# Patient Record
Sex: Male | Born: 1990 | Race: Black or African American | Hispanic: No | Marital: Single | Smoking: Never smoker
Health system: Southern US, Community
[De-identification: ages and names within clinical notes are randomized; demographics above are authoritative.]

## PROBLEM LIST (undated history)

## (undated) DIAGNOSIS — K219 Gastro-esophageal reflux disease without esophagitis: Secondary | ICD-10-CM

## (undated) DIAGNOSIS — D649 Anemia, unspecified: Secondary | ICD-10-CM

## (undated) DIAGNOSIS — L409 Psoriasis, unspecified: Secondary | ICD-10-CM

## (undated) DIAGNOSIS — K649 Unspecified hemorrhoids: Secondary | ICD-10-CM

## (undated) DIAGNOSIS — K5090 Crohn's disease, unspecified, without complications: Secondary | ICD-10-CM

## (undated) DIAGNOSIS — L309 Dermatitis, unspecified: Secondary | ICD-10-CM

## (undated) DIAGNOSIS — T7840XA Allergy, unspecified, initial encounter: Secondary | ICD-10-CM

## (undated) DIAGNOSIS — K2970 Gastritis, unspecified, without bleeding: Secondary | ICD-10-CM

## (undated) HISTORY — PX: ESOPHAGOGASTRODUODENOSCOPY: SHX1529

## (undated) HISTORY — PX: COLONOSCOPY: SHX5424

## (undated) HISTORY — DX: Gastro-esophageal reflux disease without esophagitis: K21.9

## (undated) HISTORY — DX: Crohn's disease, unspecified, without complications: K50.90

## (undated) HISTORY — DX: Anemia, unspecified: D64.9

## (undated) HISTORY — PX: APPENDECTOMY: SHX54

## (undated) HISTORY — PX: COLON SURGERY: SHX602

---

## 2015-05-18 ENCOUNTER — Inpatient Hospital Stay: Payer: BLUE CROSS/BLUE SHIELD

## 2015-05-18 ENCOUNTER — Inpatient Hospital Stay: Payer: BLUE CROSS/BLUE SHIELD | Attending: Hematology and Oncology | Admitting: Hematology and Oncology

## 2015-05-18 ENCOUNTER — Encounter (INDEPENDENT_AMBULATORY_CARE_PROVIDER_SITE_OTHER): Payer: Self-pay

## 2015-05-18 ENCOUNTER — Encounter: Payer: Self-pay | Admitting: Hematology and Oncology

## 2015-05-18 VITALS — BP 117/75 | HR 81 | Temp 96.3°F | Ht 65.0 in | Wt 120.6 lb

## 2015-05-18 DIAGNOSIS — D72819 Decreased white blood cell count, unspecified: Secondary | ICD-10-CM | POA: Insufficient documentation

## 2015-05-18 DIAGNOSIS — Z7952 Long term (current) use of systemic steroids: Secondary | ICD-10-CM | POA: Diagnosis not present

## 2015-05-18 DIAGNOSIS — K5090 Crohn's disease, unspecified, without complications: Secondary | ICD-10-CM | POA: Diagnosis not present

## 2015-05-18 DIAGNOSIS — Z79899 Other long term (current) drug therapy: Secondary | ICD-10-CM | POA: Diagnosis not present

## 2015-05-18 DIAGNOSIS — D509 Iron deficiency anemia, unspecified: Secondary | ICD-10-CM | POA: Insufficient documentation

## 2015-05-18 DIAGNOSIS — K219 Gastro-esophageal reflux disease without esophagitis: Secondary | ICD-10-CM | POA: Insufficient documentation

## 2015-05-18 DIAGNOSIS — K50919 Crohn's disease, unspecified, with unspecified complications: Secondary | ICD-10-CM

## 2015-05-18 LAB — CBC WITH DIFFERENTIAL/PLATELET
Basophils Absolute: 0.1 10*3/uL (ref 0–0.1)
Basophils Relative: 1 %
Eosinophils Absolute: 0.2 10*3/uL (ref 0–0.7)
Eosinophils Relative: 2 %
HCT: 42 % (ref 40.0–52.0)
Hemoglobin: 12.7 g/dL — ABNORMAL LOW (ref 13.0–18.0)
Lymphocytes Relative: 18 %
Lymphs Abs: 1.7 10*3/uL (ref 1.0–3.6)
MCH: 22.3 pg — ABNORMAL LOW (ref 26.0–34.0)
MCHC: 30.3 g/dL — ABNORMAL LOW (ref 32.0–36.0)
MCV: 73.6 fL — ABNORMAL LOW (ref 80.0–100.0)
Monocytes Absolute: 0.6 10*3/uL (ref 0.2–1.0)
Monocytes Relative: 6 %
Neutro Abs: 6.7 10*3/uL — ABNORMAL HIGH (ref 1.4–6.5)
Neutrophils Relative %: 73 %
Platelets: 257 10*3/uL (ref 150–440)
RBC: 5.7 MIL/uL (ref 4.40–5.90)
RDW: 15.9 % — ABNORMAL HIGH (ref 11.5–14.5)
WBC: 9.2 10*3/uL (ref 3.8–10.6)

## 2015-05-18 LAB — IRON AND TIBC
Iron: 20 ug/dL — ABNORMAL LOW (ref 45–182)
Saturation Ratios: 6 % — ABNORMAL LOW (ref 17.9–39.5)
TIBC: 353 ug/dL (ref 250–450)
UIBC: 333 ug/dL

## 2015-05-18 LAB — FERRITIN: Ferritin: 10 ng/mL — ABNORMAL LOW (ref 24–336)

## 2015-05-18 NOTE — Progress Notes (Signed)
Pt here today for initial consult regarding anemia referred by DR. Maryland Pink, PCP at [facility]; offers no complaints today

## 2015-06-11 LAB — COMPREHENSIVE METABOLIC PANEL
ALT: 8 U/L — ABNORMAL LOW (ref 17–63)
AST: 14 U/L — ABNORMAL LOW (ref 15–41)
Albumin: 3.3 g/dL — ABNORMAL LOW (ref 3.5–5.0)
Alkaline Phosphatase: 74 U/L (ref 38–126)
Anion gap: 3 — ABNORMAL LOW (ref 5–15)
BUN: 11 mg/dL (ref 6–20)
CO2: 29 mmol/L (ref 22–32)
Calcium: 8.2 mg/dL — ABNORMAL LOW (ref 8.9–10.3)
Chloride: 104 mmol/L (ref 101–111)
Creatinine, Ser: 1.05 mg/dL (ref 0.61–1.24)
GFR calc Af Amer: >60 mL/min (ref >60)
GFR calc non Af Amer: >60 mL/min (ref >60)
Glucose, Bld: 86 mg/dL (ref 65–99)
Potassium: 4.4 mmol/L (ref 3.5–5.1)
Sodium: 136 mmol/L (ref 135–145)
Total Bilirubin: 0.4 mg/dL (ref 0.3–1.2)
Total Protein: 7.5 g/dL (ref 6.5–8.1)

## 2015-08-16 ENCOUNTER — Encounter: Payer: Self-pay | Admitting: Hematology and Oncology

## 2015-08-16 ENCOUNTER — Other Ambulatory Visit: Payer: Self-pay | Admitting: Hematology and Oncology

## 2015-08-16 NOTE — Progress Notes (Signed)
[facility] day:  05/18/2015  Chief Complaint: Daniel Bird is a 24 y.o. male with Crohn's disease and recurrent iron deficiency anemia who is referred in consultation by Dr. Maryland Pink for ongoing assessment and management.  HPI: The patient was diagnosed with Crohn's disease approximately 4 years ago.  He denies any problems with his bowels. He does note "full body inflammation". He was treated years ago with Humira.  This caused a rash. He was also treated with "immunosuppressants pills" for 2 years.  He is on Cimzia (certolizumab) every 14 days.  He moved from [place] to [place] in 03/2015.  He lives with his grandmother. He states that he "normally gets iron infusions secondary to anemia and Crohn's disease". Last IV iron was either in October or November 2015. He states that he gets IV iron every 4-5 months. He has received Infed in the past.  Iron infusions started in 2015.  He has received 3-4 infusions.  Benadryl was initially given as a premedication then nothing.  He states his last colonoscopy was about a year ago.  He has an appointment with the gastroenterologist at the [facility] clinic on 07/11/2015. He denies any melana or hematochezia.  Available labs from 05/07/2014 included a hematocrit 29.3, hemoglobin 11.7, MCV 62, platelets 362,000 and white count 11,700. CBC on 06/08/2014 revealed a hematocrit 38.1, hemoglobin 10.6, MCV 70, platelets 354,000, and white count 9900. CBC on 02/03/2015 included a hematocrit of 40, hemoglobin 12.4, MCV 76, platelets 318,000, white count 13,400 with an ANC of 10,300. Ferritin was 79 on 06/08/2014 and 104 on 02/03/2015.  He has a history of a fluctuating "low white blood cell count".  He can tell when he is anemic as low blood counts "affect my energy". He currently feels "all right".   He notes that his bowels are okay (daily formed stool).  He denies any melana or hematochezia.  Past  Medical History  Diagnosis Date  . GERD (gastroesophageal reflux disease)   . Anemia     Past Surgical History  Procedure Laterality Date  . Appendectomy      May 2014   No family history on file.  He is an only child.  Social History:  reports that he has never smoked. He has never used smokeless tobacco. He reports that he does not drink alcohol or use illicit drugs.  The patient is accompanied by his great aunt, Daniel Bird.  He lives with his grandmother.  He completed college.  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    Allergies:  Allergies  Allergen Reactions  . Sulfamethoxazole-Trimethoprim Anaphylaxis    Current Medications: Current Outpatient Prescriptions  Medication Sig Dispense Refill  . Certolizumab Pegol 2 X 200 MG/ML KIT Inject into the skin.    Marland Kitchen iron dextran complex (INFED) 50 MG/ML injection Inject into the vein.    . mesalamine (LIALDA) 1.2 G EC tablet Take by mouth.    Marland Kitchen omeprazole (PRILOSEC) 40 MG capsule Take by mouth.    . prednisoLONE acetate (PRED FORTE) 1 % ophthalmic suspension Apply to eye.     No current facility-administered medications for this visit.    Review of Systems:  GENERAL:  Feels alright.  Active.  No fevers, sweats or weight loss.  Weight normally 121 pounds (lowest 116 pounds). PERFORMANCE STATUS (ECOG):  1 HEENT:  Teeth affected.  No visual changes, runny nose, sore throat, mouth sores or  tenderness. Lungs: No shortness of breath or cough.  No hemoptysis. Cardiac:  No chest pain, palpitations, orthopnea, or PND. GI:  Eats a lot. Formed daily bowel movements.  No nausea, vomiting, diarrhea, constipation, melena or hematochezia. GU:  No urgency, frequency, dysuria, or hematuria. Musculoskeletal:  No back pain.  No joint pain.  No muscle tenderness. Extremities:  No pain or swelling. Skin:  No rashes or skin changes. Neuro:  No headache, numbness or weakness, balance or coordination  issues. Endocrine:  No diabetes, thyroid issues, hot flashes or night sweats. Psych:  No mood changes, depression or anxiety. Pain:  No focal pain. Review of systems:  All other systems reviewed and found to be negative.  Physical Exam: Blood pressure 117/75, pulse 81, temperature 96.3 F (35.7 C), temperature source Tympanic, height 5' 5" (1.651 m), weight 120 lb 9.5 oz (54.701 kg). GENERAL:  Thin gentleman sitting comfortably in the exam room in no acute distress. MENTAL STATUS:  Alert and oriented to person, place and time. HEAD:  Dark curly hair.  Normocephalic, atraumatic, face symmetric, no Cushingoid features. EYES:  Pupils equal round and reactive to light and accomodation.  No conjunctivitis or scleral icterus. ENT:  Oropharynx clear without lesion.  Edentulous.  Tongue normal. Mucous membranes moist.  RESPIRATORY:  Clear to auscultation without rales, wheezes or rhonchi. CARDIOVASCULAR:  Regular rate and rhythm without murmur, rub or gallop. ABDOMEN:  Well healed midline incision.  Soft, non-tender, with active bowel sounds, and no hepatosplenomegaly.  No masses. SKIN:  No rashes, ulcers or lesions. EXTREMITIES: No edema, no skin discoloration or tenderness.  No palpable cords. LYMPH NODES: No palpable cervical, supraclavicular, axillary or inguinal adenopathy  NEUROLOGICAL: Unremarkable. PSYCH:  Appropriate.  Appointment on 05/18/2015  Component Date Value Ref Range Status  . WBC 05/18/2015 9.2  3.8 - 10.6 K/uL Final  . RBC 05/18/2015 5.70  4.40 - 5.90 MIL/uL Final  . Hemoglobin 05/18/2015 12.7* 13.0 - 18.0 g/dL Final   RESULT REPEATED AND VERIFIED  . HCT 05/18/2015 42.0  40.0 - 52.0 % Final   RESULT REPEATED AND VERIFIED  . MCV 05/18/2015 73.6* 80.0 - 100.0 fL Final  . MCH 05/18/2015 22.3* 26.0 - 34.0 pg Final  . MCHC 05/18/2015 30.3* 32.0 - 36.0 g/dL Final  . RDW 05/18/2015 15.9* 11.5 - 14.5 % Final  . Platelets 05/18/2015 257  150 - 440 K/uL Final  . Neutrophils  Relative % 05/18/2015 73%   Final  . Neutro Abs 05/18/2015 6.7* 1.4 - 6.5 K/uL Final  . Lymphocytes Relative 05/18/2015 18%   Final  . Lymphs Abs 05/18/2015 1.7  1.0 - 3.6 K/uL Final  . Monocytes Relative 05/18/2015 6%   Final  . Monocytes Absolute 05/18/2015 0.6  0.2 - 1.0 K/uL Final  . Eosinophils Relative 05/18/2015 2%   Final  . Eosinophils Absolute 05/18/2015 0.2  0 - 0.7 K/uL Final  . Basophils Relative 05/18/2015 1%   Final  . Basophils Absolute 05/18/2015 0.1  0 - 0.1 K/uL Final  . Ferritin 05/18/2015 10* 24 - 336 ng/mL Final  . Iron 05/18/2015 20* 45 - 182 ug/dL Final  . TIBC 05/18/2015 353  250 - 450 ug/dL Final  . Saturation Ratios 05/18/2015 6* 17.9 - 39.5 % Final  . UIBC 05/18/2015 333   Final  . Sodium 05/18/2015 136  135 - 145 mmol/L Final  . Potassium 05/18/2015 4.4  3.5 - 5.1 mmol/L Final  . Chloride 05/18/2015 104  101 - 111 mmol/L Final  .  CO2 05/18/2015 29  22 - 32 mmol/L Final  . Glucose, Bld 05/18/2015 86  65 - 99 mg/dL Final  . BUN 05/18/2015 11  6 - 20 mg/dL Final  . Creatinine, Ser 05/18/2015 1.05  0.61 - 1.24 mg/dL Final  . Calcium 05/18/2015 8.2* 8.9 - 10.3 mg/dL Final  . Total Protein 05/18/2015 7.5  6.5 - 8.1 g/dL Final  . Albumin 05/18/2015 3.3* 3.5 - 5.0 g/dL Final  . AST 05/18/2015 14* 15 - 41 U/L Final  . ALT 05/18/2015 8* 17 - 63 U/L Final  . Alkaline Phosphatase 05/18/2015 74  38 - 126 U/L Final  . Total Bilirubin 05/18/2015 0.4  0.3 - 1.2 mg/dL Final  . GFR calc non Af Amer 05/18/2015 >60  >60 mL/min Final  . GFR calc Af Amer 05/18/2015 >60  >60 mL/min Final  . Anion gap 05/18/2015 3* 5 - 15 Final    Assessment:  Keishaun Hazel is a 24 y.o. male with Crohn's disease and recurrent iron deficiency anemia.  He received IV iron 3-4 times in 2015 while living in [place].  Colonoscopy in 2015 was negative per patient report.  Symptomatically, he is doing well.  Exam is unremarkable. Diet is good.  He is not having any issues with his bowels.  He  has chronic microcytic RBC indices.  Hematocrit is stable to improved from 01/2015.  He is not anemic.    Plan: 1. Labs today:  CBC with diff, CMP, ferritin, iron studies. 2. Discuss normal CBC today, but based on history, anticipate need for IV iron in next few months. 3. Follow-up as scheduled with gastroenterology on 07/11/2015. 4. RTC in 3 months for MD assessment and labs (CBC with diff, ferritin).   Lequita Asal, MD  05/18/2015

## 2015-08-17 ENCOUNTER — Other Ambulatory Visit: Payer: Self-pay | Admitting: Hematology and Oncology

## 2015-08-17 ENCOUNTER — Inpatient Hospital Stay: Payer: BLUE CROSS/BLUE SHIELD | Attending: Hematology and Oncology

## 2015-08-17 ENCOUNTER — Inpatient Hospital Stay (HOSPITAL_BASED_OUTPATIENT_CLINIC_OR_DEPARTMENT_OTHER): Payer: BLUE CROSS/BLUE SHIELD | Admitting: Hematology and Oncology

## 2015-08-17 VITALS — BP 112/71 | HR 76 | Temp 95.2°F | Resp 18 | Ht 64.0 in | Wt 117.4 lb

## 2015-08-17 DIAGNOSIS — K5090 Crohn's disease, unspecified, without complications: Secondary | ICD-10-CM

## 2015-08-17 DIAGNOSIS — R5383 Other fatigue: Secondary | ICD-10-CM

## 2015-08-17 DIAGNOSIS — Z79899 Other long term (current) drug therapy: Secondary | ICD-10-CM

## 2015-08-17 DIAGNOSIS — K50919 Crohn's disease, unspecified, with unspecified complications: Secondary | ICD-10-CM

## 2015-08-17 DIAGNOSIS — D509 Iron deficiency anemia, unspecified: Secondary | ICD-10-CM | POA: Insufficient documentation

## 2015-08-17 DIAGNOSIS — K219 Gastro-esophageal reflux disease without esophagitis: Secondary | ICD-10-CM | POA: Insufficient documentation

## 2015-08-17 LAB — CBC WITH DIFFERENTIAL/PLATELET
Basophils Absolute: 0 10*3/uL (ref 0–0.1)
Basophils Relative: 1 %
Eosinophils Absolute: 0.1 10*3/uL (ref 0–0.7)
Eosinophils Relative: 1 %
HCT: 35.9 % — ABNORMAL LOW (ref 40.0–52.0)
Hemoglobin: 10.8 g/dL — ABNORMAL LOW (ref 13.0–18.0)
Lymphocytes Relative: 19 %
Lymphs Abs: 1.5 10*3/uL (ref 1.0–3.6)
MCH: 20.4 pg — ABNORMAL LOW (ref 26.0–34.0)
MCHC: 30.2 g/dL — ABNORMAL LOW (ref 32.0–36.0)
MCV: 67.7 fL — ABNORMAL LOW (ref 80.0–100.0)
Monocytes Absolute: 0.6 10*3/uL (ref 0.2–1.0)
Monocytes Relative: 7 %
Neutro Abs: 5.9 10*3/uL (ref 1.4–6.5)
Neutrophils Relative %: 72 %
Platelets: 290 10*3/uL (ref 150–440)
RBC: 5.31 MIL/uL (ref 4.40–5.90)
RDW: 17.6 % — ABNORMAL HIGH (ref 11.5–14.5)
WBC: 8.2 10*3/uL (ref 3.8–10.6)

## 2015-08-17 LAB — IRON AND TIBC
Iron: 18 ug/dL — ABNORMAL LOW (ref 45–182)
Saturation Ratios: 5 % — ABNORMAL LOW (ref 17.9–39.5)
TIBC: 375 ug/dL (ref 250–450)
UIBC: 357 ug/dL

## 2015-08-17 LAB — FOLATE: Folate: 12.1 ng/mL (ref >5.9)

## 2015-08-17 LAB — VITAMIN B12: Vitamin B-12: 455 pg/mL (ref 180–914)

## 2015-08-17 LAB — FERRITIN: Ferritin: 11 ng/mL — ABNORMAL LOW (ref 24–336)

## 2015-08-17 NOTE — Progress Notes (Signed)
No changes since last visit here for follow up anemia

## 2015-08-17 NOTE — Progress Notes (Signed)
[facility] day:  08/17/2015   Chief Complaint: Daniel Bird is a 24 y.o. male with Crohn's disease and recurrent iron deficiency anemia who is seen for 3 month assessment.  HPI: The patient  was last seen in the medical oncology clinic on 05/18/2015.  At that time, he was seen for initial consultation.  He had iron deficiency anemia with a history of Crohn's disease.  His last colonoscopy was a year ago in [place].  He had received IV iron 3-4 times in the past.  CBC revealed a hematocrit of 42.0, hemoglobin 12.7, MCV 73.6, and ferritin 10.  During the interim, he has had low energy. He feels more tired than last visit. He describes sleeping more. He is not working. He is in school. He is eating well. He denies any bowel symptoms. He states that it has not been a year since his last colonoscopy (10 or 10/2014).  He denies any melana or hematochezia.  Past Medical History  Diagnosis Date  . GERD (gastroesophageal reflux disease)   . Anemia     Past Surgical History  Procedure Laterality Date  . Appendectomy      May 2014   No family history on file.  He is an only child.  Social History:  reports that he has never smoked. He has never used smokeless tobacco. He reports that he does not drink alcohol or use illicit drugs.  The patient is accompanied by his great aunt, Vaughan Basta.  He lives with his grandmother.  He completed college.  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  Allergies:  Allergies  Allergen Reactions  . Sulfamethoxazole-Trimethoprim Anaphylaxis  . Rifampin Other (See Comments)    Flu like illness    Current Medications: Current Outpatient Prescriptions  Medication Sig Dispense Refill  . budesonide (ENTOCORT EC) 3 MG 24 hr capsule Take by mouth.    . ciprofloxacin (CIPRO) 500 MG tablet Take by mouth.    . mesalamine (LIALDA) 1.2 G EC tablet Take by  mouth.    Marland Kitchen omeprazole (PRILOSEC) 40 MG capsule Take by mouth.    . prednisoLONE acetate (PRED FORTE) 1 % ophthalmic suspension Apply to eye.    . Certolizumab Pegol 2 X 200 MG/ML KIT Inject into the skin.    Marland Kitchen CIMZIA PREFILLED 2 X 200 MG/ML KIT   0   No current facility-administered medications for this visit.    Review of Systems:  GENERAL:  Feels tired.  No fevers or sweats.  Weight normally 121 pounds (lowest 116 pounds). PERFORMANCE STATUS (ECOG):  1 HEENT:  Teeth affected.  No visual changes, runny nose, sore throat, mouth sores or tenderness. Lungs: No shortness of breath or cough.  No hemoptysis. Cardiac:  No chest pain, palpitations, orthopnea, or PND. GI:  Eating more. Formed daily bowel movements.  No nausea, vomiting, diarrhea, constipation, melena or hematochezia. GU:  No urgency, frequency, dysuria, or hematuria. Musculoskeletal:  No back pain.  No joint pain.  No muscle tenderness. Extremities:  No pain or swelling. Skin:  No rashes or skin changes. Neuro:  No headache, numbness or weakness, balance or coordination issues. Endocrine:  No diabetes, thyroid issues, hot flashes or night sweats. Psych:  No mood changes, depression or anxiety. Pain:  No focal pain. Review of systems:  All other systems reviewed and found to be negative.  Physical Exam: Blood pressure 112/71, pulse  76, temperature 95.2 F (35.1 C), temperature source Tympanic, resp. rate 18, height 5' 4"  (1.626 m), weight 117 lb 6.3 oz (53.25 kg). GENERAL:  Thin gentleman sitting comfortably in the exam room in no acute distress. MENTAL STATUS:  Alert and oriented to person, place and time. HEAD:  Dark curly hair.  Temporal wasting.  Normocephalic, atraumatic, face symmetric, no Cushingoid features. EYES:  Pupils equal round and reactive to light and accomodation.  No conjunctivitis or scleral icterus. ENT:  Oropharynx clear without lesion.  Edentulous.  Tongue normal. Mucous membranes moist.  RESPIRATORY:   Clear to auscultation without rales, wheezes or rhonchi. CARDIOVASCULAR:  Regular rate and rhythm without murmur, rub or gallop. ABDOMEN:  Well healed midline incision.  Soft, non-tender, with active bowel sounds, and no hepatosplenomegaly.  No masses. SKIN:  No rashes, ulcers or lesions. EXTREMITIES: No edema, no skin discoloration or tenderness.  No palpable cords. LYMPH NODES: No palpable cervical, supraclavicular, axillary or inguinal adenopathy  NEUROLOGICAL: Unremarkable. PSYCH:  Appropriate.  No visits with results within 3 Day(s) from this visit. Latest known visit with results is:  Appointment on 05/18/2015  Component Date Value Ref Range Status  . WBC 05/18/2015 9.2  3.8 - 10.6 K/uL Final  . RBC 05/18/2015 5.70  4.40 - 5.90 MIL/uL Final  . Hemoglobin 05/18/2015 12.7* 13.0 - 18.0 g/dL Final   RESULT REPEATED AND VERIFIED  . HCT 05/18/2015 42.0  40.0 - 52.0 % Final   RESULT REPEATED AND VERIFIED  . MCV 05/18/2015 73.6* 80.0 - 100.0 fL Final  . MCH 05/18/2015 22.3* 26.0 - 34.0 pg Final  . MCHC 05/18/2015 30.3* 32.0 - 36.0 g/dL Final  . RDW 05/18/2015 15.9* 11.5 - 14.5 % Final  . Platelets 05/18/2015 257  150 - 440 K/uL Final  . Neutrophils Relative % 05/18/2015 73%   Final  . Neutro Abs 05/18/2015 6.7* 1.4 - 6.5 K/uL Final  . Lymphocytes Relative 05/18/2015 18%   Final  . Lymphs Abs 05/18/2015 1.7  1.0 - 3.6 K/uL Final  . Monocytes Relative 05/18/2015 6%   Final  . Monocytes Absolute 05/18/2015 0.6  0.2 - 1.0 K/uL Final  . Eosinophils Relative 05/18/2015 2%   Final  . Eosinophils Absolute 05/18/2015 0.2  0 - 0.7 K/uL Final  . Basophils Relative 05/18/2015 1%   Final  . Basophils Absolute 05/18/2015 0.1  0 - 0.1 K/uL Final  . Ferritin 05/18/2015 10* 24 - 336 ng/mL Final  . Iron 05/18/2015 20* 45 - 182 ug/dL Final  . TIBC 05/18/2015 353  250 - 450 ug/dL Final  . Saturation Ratios 05/18/2015 6* 17.9 - 39.5 % Final  . UIBC 05/18/2015 333   Final  . Sodium 05/18/2015 136   135 - 145 mmol/L Final  . Potassium 05/18/2015 4.4  3.5 - 5.1 mmol/L Final  . Chloride 05/18/2015 104  101 - 111 mmol/L Final  . CO2 05/18/2015 29  22 - 32 mmol/L Final  . Glucose, Bld 05/18/2015 86  65 - 99 mg/dL Final  . BUN 05/18/2015 11  6 - 20 mg/dL Final  . Creatinine, Ser 05/18/2015 1.05  0.61 - 1.24 mg/dL Final  . Calcium 05/18/2015 8.2* 8.9 - 10.3 mg/dL Final  . Total Protein 05/18/2015 7.5  6.5 - 8.1 g/dL Final  . Albumin 05/18/2015 3.3* 3.5 - 5.0 g/dL Final  . AST 05/18/2015 14* 15 - 41 U/L Final  . ALT 05/18/2015 8* 17 - 63 U/L Final  . Alkaline Phosphatase 05/18/2015 74  38 - 126 U/L Final  . Total Bilirubin 05/18/2015 0.4  0.3 - 1.2 mg/dL Final  . GFR calc non Af Amer 05/18/2015 >60  >60 mL/min Final  . GFR calc Af Amer 05/18/2015 >60  >60 mL/min Final  . Anion gap 05/18/2015 3* 5 - 15 Final    Assessment:  Daniel Bird is a 24 y.o. male with Crohn's disease and recurrent iron deficiency anemia.  He received IV iron 3-4 times in 2015 while living in [place].  Colonoscopy in 2015 was negative per patient report.  Symptomatically, he is fatigued.  Exam is unremarkable. Diet is good.  He denies any bowel issues.  Hematocrit has drifted down from 42 on 05/18/2015 to 35.9 today.  RBCs are more microcytic.  Ferritin is 10.    Plan: 1. Labs today:  CBC with diff, CMP, ferritin, iron studies. 2. Follow-up preauth for Feraheme. 3. Once preauthorized, schedule Feraheme x1 given patient's weight. 4. RTC in 6 weeks for labs (CBC with diff, ferritin). 5. RTC in 3 months for MD assessment and labs (CBC with diff, ferritin) +/- Feraheme.   Lequita Asal, MD  08/17/2015, 3:35 PM

## 2015-08-22 ENCOUNTER — Other Ambulatory Visit: Payer: Self-pay | Admitting: Hematology and Oncology

## 2015-08-27 ENCOUNTER — Inpatient Hospital Stay: Payer: BLUE CROSS/BLUE SHIELD | Attending: Hematology and Oncology

## 2015-08-27 DIAGNOSIS — K5090 Crohn's disease, unspecified, without complications: Secondary | ICD-10-CM | POA: Diagnosis not present

## 2015-08-27 DIAGNOSIS — D509 Iron deficiency anemia, unspecified: Secondary | ICD-10-CM

## 2015-08-27 DIAGNOSIS — D72819 Decreased white blood cell count, unspecified: Secondary | ICD-10-CM | POA: Insufficient documentation

## 2015-08-27 MED ORDER — FERUMOXYTOL INJECTION 510 MG/17 ML
510.0000 mg | Freq: Once | INTRAVENOUS | Status: AC
  Administered 2015-08-27: 510 mg via INTRAVENOUS
  Filled 2015-08-27: qty 17

## 2015-08-27 MED ORDER — SODIUM CHLORIDE 0.9 % IV SOLN
Freq: Once | INTRAVENOUS | Status: AC
  Administered 2015-08-27: 10 mL/h via INTRAVENOUS
  Filled 2015-08-27: qty 1000

## 2015-09-03 ENCOUNTER — Ambulatory Visit: Payer: BLUE CROSS/BLUE SHIELD

## 2015-09-28 ENCOUNTER — Inpatient Hospital Stay: Payer: BLUE CROSS/BLUE SHIELD | Attending: Hematology and Oncology

## 2015-09-28 ENCOUNTER — Other Ambulatory Visit: Payer: Self-pay

## 2015-09-28 DIAGNOSIS — K5090 Crohn's disease, unspecified, without complications: Secondary | ICD-10-CM | POA: Insufficient documentation

## 2015-09-28 DIAGNOSIS — D509 Iron deficiency anemia, unspecified: Secondary | ICD-10-CM

## 2015-09-28 DIAGNOSIS — D72819 Decreased white blood cell count, unspecified: Secondary | ICD-10-CM | POA: Insufficient documentation

## 2015-09-28 LAB — CBC WITH DIFFERENTIAL/PLATELET
Basophils Absolute: 0.1 10*3/uL (ref 0–0.1)
Basophils Relative: 1 %
Eosinophils Absolute: 0.1 10*3/uL (ref 0–0.7)
Eosinophils Relative: 2 %
HCT: 41.1 % (ref 40.0–52.0)
Hemoglobin: 12.5 g/dL — ABNORMAL LOW (ref 13.0–18.0)
Lymphocytes Relative: 25 %
Lymphs Abs: 1.8 10*3/uL (ref 1.0–3.6)
MCH: 21.6 pg — ABNORMAL LOW (ref 26.0–34.0)
MCHC: 30.5 g/dL — ABNORMAL LOW (ref 32.0–36.0)
MCV: 70.8 fL — ABNORMAL LOW (ref 80.0–100.0)
Monocytes Absolute: 0.5 10*3/uL (ref 0.2–1.0)
Monocytes Relative: 7 %
Neutro Abs: 4.6 10*3/uL (ref 1.4–6.5)
Neutrophils Relative %: 65 %
Platelets: 250 10*3/uL (ref 150–440)
RBC: 5.8 MIL/uL (ref 4.40–5.90)
RDW: 21.5 % — ABNORMAL HIGH (ref 11.5–14.5)
WBC: 7.2 10*3/uL (ref 3.8–10.6)

## 2015-09-28 LAB — IRON AND TIBC
Iron: 26 ug/dL — ABNORMAL LOW (ref 45–182)
Saturation Ratios: 8 % — ABNORMAL LOW (ref 17.9–39.5)
TIBC: 333 ug/dL (ref 250–450)
UIBC: 307 ug/dL

## 2015-09-28 LAB — FERRITIN: Ferritin: 13 ng/mL — ABNORMAL LOW (ref 24–336)

## 2015-11-09 ENCOUNTER — Ambulatory Visit: Payer: BLUE CROSS/BLUE SHIELD | Admitting: Hematology and Oncology

## 2015-11-09 ENCOUNTER — Other Ambulatory Visit: Payer: BLUE CROSS/BLUE SHIELD

## 2015-11-09 ENCOUNTER — Ambulatory Visit: Payer: BLUE CROSS/BLUE SHIELD

## 2015-11-09 ENCOUNTER — Ambulatory Visit: Payer: BLUE CROSS/BLUE SHIELD | Admitting: Oncology

## 2015-11-11 ENCOUNTER — Inpatient Hospital Stay: Payer: BLUE CROSS/BLUE SHIELD

## 2015-11-11 ENCOUNTER — Inpatient Hospital Stay (HOSPITAL_BASED_OUTPATIENT_CLINIC_OR_DEPARTMENT_OTHER): Payer: BLUE CROSS/BLUE SHIELD | Admitting: Family Medicine

## 2015-11-11 ENCOUNTER — Ambulatory Visit: Payer: BLUE CROSS/BLUE SHIELD

## 2015-11-11 ENCOUNTER — Inpatient Hospital Stay: Payer: BLUE CROSS/BLUE SHIELD | Attending: Hematology and Oncology

## 2015-11-11 ENCOUNTER — Ambulatory Visit: Payer: BLUE CROSS/BLUE SHIELD | Admitting: Hematology and Oncology

## 2015-11-11 ENCOUNTER — Encounter: Payer: Self-pay | Admitting: Hematology and Oncology

## 2015-11-11 VITALS — BP 121/71 | HR 79 | Temp 95.8°F | Resp 18 | Ht 64.0 in | Wt 120.8 lb

## 2015-11-11 DIAGNOSIS — K5090 Crohn's disease, unspecified, without complications: Secondary | ICD-10-CM | POA: Diagnosis not present

## 2015-11-11 DIAGNOSIS — D509 Iron deficiency anemia, unspecified: Secondary | ICD-10-CM | POA: Insufficient documentation

## 2015-11-11 DIAGNOSIS — K219 Gastro-esophageal reflux disease without esophagitis: Secondary | ICD-10-CM

## 2015-11-11 DIAGNOSIS — Z79899 Other long term (current) drug therapy: Secondary | ICD-10-CM | POA: Diagnosis not present

## 2015-11-11 LAB — CBC WITH DIFFERENTIAL/PLATELET
Basophils Absolute: 0 10*3/uL (ref 0–0.1)
Basophils Relative: 1 %
Eosinophils Absolute: 0.1 10*3/uL (ref 0–0.7)
Eosinophils Relative: 2 %
HCT: 40.8 % (ref 40.0–52.0)
Hemoglobin: 12.4 g/dL — ABNORMAL LOW (ref 13.0–18.0)
Lymphocytes Relative: 23 %
Lymphs Abs: 1.6 10*3/uL (ref 1.0–3.6)
MCH: 21.3 pg — ABNORMAL LOW (ref 26.0–34.0)
MCHC: 30.5 g/dL — ABNORMAL LOW (ref 32.0–36.0)
MCV: 69.8 fL — ABNORMAL LOW (ref 80.0–100.0)
Monocytes Absolute: 0.5 10*3/uL (ref 0.2–1.0)
Monocytes Relative: 7 %
Neutro Abs: 4.8 10*3/uL (ref 1.4–6.5)
Neutrophils Relative %: 67 %
Platelets: 253 10*3/uL (ref 150–440)
RBC: 5.85 MIL/uL (ref 4.40–5.90)
RDW: 19.5 % — ABNORMAL HIGH (ref 11.5–14.5)
WBC: 7.1 10*3/uL (ref 3.8–10.6)

## 2015-11-11 LAB — FERRITIN: Ferritin: 7 ng/mL — ABNORMAL LOW (ref 24–336)

## 2015-11-11 NOTE — Progress Notes (Signed)
[facility] day:  11/11/2015   Chief Complaint: Daniel Bird is a 24 y.o. male with Crohn's disease and recurrent iron deficiency anemia who is seen for 3 month assessment.  HPI: The patient  was last seen in the medical oncology clinic on 08/17/2015.  He had iron deficiency anemia with a history of Crohn's disease.  His last colonoscopy was a year ago in [place].  He had received IV iron 3-4 times in the past.  CBC on revealed a hematocrit of 35.9, hemoglobin 10.8, MCV 67.7, and ferritin 11.  During the interim, He has had no complaints. He is not working. He is in school. He is eating well. He denies any bowel symptoms. He states that it has not been a year since his last colonoscopy (10 or 10/2014).  He denies any melana or hematochezia.  Past Medical History  Diagnosis Date  . GERD (gastroesophageal reflux disease)   . Anemia     Past Surgical History  Procedure Laterality Date  . Appendectomy      May 2014   No family history on file.  He is an only child.  Social History:  reports that he has never smoked. He has never used smokeless tobacco. He reports that he does not drink alcohol or use illicit drugs.  He lives with his grandmother.  He completed college.  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    Allergies:  Allergies  Allergen Reactions  . Sulfamethoxazole-Trimethoprim Anaphylaxis  . Rifampin Other (See Comments)    Flu like illness    Current Medications: Current Outpatient Prescriptions  Medication Sig Dispense Refill  . budesonide (ENTOCORT EC) 3 MG 24 hr capsule Take by mouth.    Marland Kitchen CIMZIA PREFILLED 2 X 200 MG/ML KIT   0  . isoniazid (NYDRAZID) 300 MG tablet Take by mouth.    . mesalamine (LIALDA) 1.2 G EC tablet Take by mouth.    Marland Kitchen omeprazole (PRILOSEC) 40 MG capsule Take by mouth.    . prednisoLONE acetate (PRED FORTE) 1 % ophthalmic suspension Apply to  eye.    . Certolizumab Pegol 2 X 200 MG/ML KIT Inject into the skin.     No current facility-administered medications for this visit.    Review of Systems:  GENERAL:  No fevers or sweats.  Weight normally 121 pounds (lowest 116 pounds). PERFORMANCE STATUS (ECOG):  1 HEENT:  Teeth affected.  No visual changes, runny nose, sore throat, mouth sores or tenderness. Lungs: No shortness of breath or cough.  No hemoptysis. Cardiac:  No chest pain, palpitations, orthopnea, or PND. GI:  Eating more. Formed daily bowel movements.  No nausea, vomiting, diarrhea, constipation, melena or hematochezia. GU:  No urgency, frequency, dysuria, or hematuria. Musculoskeletal:  No back pain.  No joint pain.  No muscle tenderness. Extremities:  No pain or swelling. Skin:  No rashes or skin changes. Neuro:  No headache, numbness or weakness, balance or coordination issues. Endocrine:  No diabetes, thyroid issues, hot flashes or night sweats. Psych:  No mood changes, depression or anxiety. Pain:  No focal pain. Review of systems:  All other systems reviewed and found to be negative.  Physical Exam: Blood pressure 121/71, pulse 79, temperature 95.8 F (35.4 C), temperature source Tympanic, resp. rate 18, height _0  (1.626 m), weight 120 lb 13 oz (54.8 kg). GENERAL:  Thin gentleman sitting comfortably in the exam room in no  acute distress. MENTAL STATUS:  Alert and oriented to person, place and time. HEAD:  Dark curly hair.  Temporal wasting.  Normocephalic, atraumatic, face symmetric, no Cushingoid features. EYES:  Pupils equal round and reactive to light and accomodation.  No conjunctivitis or scleral icterus. ENT:  Oropharynx clear without lesion.  Edentulous.  Tongue normal. Mucous membranes moist.  RESPIRATORY:  Clear to auscultation without rales, wheezes or rhonchi. CARDIOVASCULAR:  Regular rate and rhythm without murmur, rub or gallop. ABDOMEN:  Well healed midline incision.  Soft, non-tender, with  active bowel sounds, and no hepatosplenomegaly.  No masses. SKIN:  No rashes, ulcers or lesions. EXTREMITIES: No edema, no skin discoloration or tenderness.  No palpable cords. LYMPH NODES: No palpable cervical, supraclavicular, axillary or inguinal adenopathy  NEUROLOGICAL: Unremarkable. PSYCH:  Appropriate.  Appointment on 11/11/2015  Component Date Value Ref Range Status  . WBC 11/11/2015 7.1  3.8 - 10.6 K/uL Final  . RBC 11/11/2015 5.85  4.40 - 5.90 MIL/uL Final  . Hemoglobin 11/11/2015 12.4* 13.0 - 18.0 g/dL Final   RESULT REPEATED AND VERIFIED  . HCT 11/11/2015 40.8  40.0 - 52.0 % Final   RESULT REPEATED AND VERIFIED  . MCV 11/11/2015 69.8* 80.0 - 100.0 fL Final  . MCH 11/11/2015 21.3* 26.0 - 34.0 pg Final  . MCHC 11/11/2015 30.5* 32.0 - 36.0 g/dL Final  . RDW 11/11/2015 19.5* 11.5 - 14.5 % Final  . Platelets 11/11/2015 253  150 - 440 K/uL Final  . Neutrophils Relative % 11/11/2015 67%   Final  . Neutro Abs 11/11/2015 4.8  1.4 - 6.5 K/uL Final  . Lymphocytes Relative 11/11/2015 23%   Final  . Lymphs Abs 11/11/2015 1.6  1.0 - 3.6 K/uL Final  . Monocytes Relative 11/11/2015 7%   Final  . Monocytes Absolute 11/11/2015 0.5  0.2 - 1.0 K/uL Final  . Eosinophils Relative 11/11/2015 2%   Final  . Eosinophils Absolute 11/11/2015 0.1  0 - 0.7 K/uL Final  . Basophils Relative 11/11/2015 1%   Final  . Basophils Absolute 11/11/2015 0.0  0 - 0.1 K/uL Final    Assessment:  Daniel Bird is a 24 y.o. male with Crohn's disease and recurrent iron deficiency anemia.  He received IV iron 3-4 times in 2015 while living in [place].  Colonoscopy in 2015 was negative per patient report.  Symptomatically, he has no complaints today.  Exam is unremarkable. Diet is good.  He denies any bowel issues.  Hematocrit has drifted slightly to 40.8.  RBCs are more microcytic.  Ferritin is 7.    Plan: 1. Labs today:  CBC with diff, CMP, ferritin, iron studies. 2. RTC tomorrow for feraheme  infusion. 3. RTC in 3 months for MD assessment and labs (CBC with diff, ferritin) +/- Feraheme.   Evlyn Kanner, NP  11/11/2015, 3:25 PM

## 2015-11-11 NOTE — Progress Notes (Signed)
Pt reports no changes since last MD visit.  No concerns today.

## 2015-11-12 ENCOUNTER — Inpatient Hospital Stay: Payer: BLUE CROSS/BLUE SHIELD

## 2015-11-12 ENCOUNTER — Other Ambulatory Visit: Payer: Self-pay | Admitting: Family Medicine

## 2015-11-12 VITALS — BP 119/73 | HR 82 | Temp 97.8°F | Resp 18

## 2015-11-12 DIAGNOSIS — D509 Iron deficiency anemia, unspecified: Secondary | ICD-10-CM

## 2015-11-12 MED ORDER — SODIUM CHLORIDE 0.9 % IV SOLN
510.0000 mg | Freq: Once | INTRAVENOUS | Status: AC
  Administered 2015-11-12: 510 mg via INTRAVENOUS
  Filled 2015-11-12: qty 17

## 2015-11-12 MED ORDER — SODIUM CHLORIDE 0.9 % IV SOLN
Freq: Once | INTRAVENOUS | Status: AC
  Administered 2015-11-12: 14:00:00 via INTRAVENOUS
  Filled 2015-11-12: qty 1000

## 2015-11-17 ENCOUNTER — Other Ambulatory Visit: Payer: Self-pay | Admitting: Family Medicine

## 2015-11-17 NOTE — Progress Notes (Unsigned)
Addendum to 11/11/15 note: Dr. Grayland Ormond was available for consultation and review of plan of care for this patient.

## 2016-01-28 ENCOUNTER — Other Ambulatory Visit: Payer: Self-pay

## 2016-01-28 DIAGNOSIS — D509 Iron deficiency anemia, unspecified: Secondary | ICD-10-CM

## 2016-02-03 ENCOUNTER — Other Ambulatory Visit: Payer: Self-pay | Admitting: Hematology and Oncology

## 2016-02-03 ENCOUNTER — Inpatient Hospital Stay (HOSPITAL_BASED_OUTPATIENT_CLINIC_OR_DEPARTMENT_OTHER): Payer: BLUE CROSS/BLUE SHIELD | Admitting: Family Medicine

## 2016-02-03 ENCOUNTER — Encounter: Payer: Self-pay | Admitting: Family Medicine

## 2016-02-03 ENCOUNTER — Inpatient Hospital Stay: Payer: BLUE CROSS/BLUE SHIELD | Attending: Internal Medicine

## 2016-02-03 ENCOUNTER — Inpatient Hospital Stay: Payer: BLUE CROSS/BLUE SHIELD

## 2016-02-03 VITALS — BP 106/69 | HR 77 | Temp 97.7°F | Resp 18 | Ht 64.0 in | Wt 122.6 lb

## 2016-02-03 DIAGNOSIS — D509 Iron deficiency anemia, unspecified: Secondary | ICD-10-CM

## 2016-02-03 DIAGNOSIS — K219 Gastro-esophageal reflux disease without esophagitis: Secondary | ICD-10-CM

## 2016-02-03 DIAGNOSIS — K5090 Crohn's disease, unspecified, without complications: Secondary | ICD-10-CM

## 2016-02-03 DIAGNOSIS — Z79899 Other long term (current) drug therapy: Secondary | ICD-10-CM

## 2016-02-03 LAB — CBC WITH DIFFERENTIAL/PLATELET
Basophils Absolute: 0 10*3/uL (ref 0–0.1)
Basophils Relative: 1 %
Eosinophils Absolute: 0.2 10*3/uL (ref 0–0.7)
Eosinophils Relative: 2 %
HCT: 44.8 % (ref 40.0–52.0)
Hemoglobin: 14.2 g/dL (ref 13.0–18.0)
Lymphocytes Relative: 22 %
Lymphs Abs: 1.6 10*3/uL (ref 1.0–3.6)
MCH: 23.9 pg — ABNORMAL LOW (ref 26.0–34.0)
MCHC: 31.7 g/dL — ABNORMAL LOW (ref 32.0–36.0)
MCV: 75.3 fL — ABNORMAL LOW (ref 80.0–100.0)
Monocytes Absolute: 0.6 10*3/uL (ref 0.2–1.0)
Monocytes Relative: 8 %
Neutro Abs: 4.9 10*3/uL (ref 1.4–6.5)
Neutrophils Relative %: 67 %
Platelets: 220 10*3/uL (ref 150–440)
RBC: 5.95 MIL/uL — ABNORMAL HIGH (ref 4.40–5.90)
RDW: 20.2 % — ABNORMAL HIGH (ref 11.5–14.5)
WBC: 7.4 10*3/uL (ref 3.8–10.6)

## 2016-02-03 LAB — FERRITIN: Ferritin: 11 ng/mL — ABNORMAL LOW (ref 24–336)

## 2016-02-03 NOTE — Progress Notes (Signed)
Pt here for f/u IDA, Crohn's is doing good. No blood in stool or urine. Pt fatigue is good. Does not have to take rest or break in chores or daily living. Awaiting ferritin to see if pt needs it today

## 2016-02-03 NOTE — Progress Notes (Signed)
[facility] day:  02/03/2016   Chief Complaint: Daniel Bird is a 25 y.o. male with Crohn's disease and recurrent iron deficiency anemia who is seen for 3 month assessment.  HPI: The patient  was last seen in the medical oncology clinic on 08/17/2015.  He had iron deficiency anemia with a history of Crohn's disease.  His last colonoscopy was a year ago in [place].  He had received IV iron 3-4 times in the past.  CBC in November 2016 revealed a hematocrit of 40.8, hemoglobin 12.4, MCV 69.8, and ferritin 7.  During the interim, He has had no complaints. He is not working. He is in school. He is eating well. He denies any bowel symptoms. He states that it has not been a year since his last colonoscopy (10 or 10/2014).  He denies any melana or hematochezia.  Past Medical History  Diagnosis Date  . GERD (gastroesophageal reflux disease)   . Anemia   . Crohn's disease [facility])     Past Surgical History  Procedure Laterality Date  . Appendectomy      May 2014   No family history on file.  He is an only child.  Social History:  reports that he has never smoked. He has never used smokeless tobacco. He reports that he does not drink alcohol or use illicit drugs.  He lives with his grandmother.  He completed college.  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    Allergies:  Allergies  Allergen Reactions  . Sulfamethoxazole-Trimethoprim Anaphylaxis  . Rifampin Other (See Comments)    Flu like illness    Current Medications: Current Outpatient Prescriptions  Medication Sig Dispense Refill  . budesonide (ENTOCORT EC) 3 MG 24 hr capsule Take by mouth.    Marland Kitchen CIMZIA PREFILLED 2 X 200 MG/ML KIT   0  . isoniazid (NYDRAZID) 300 MG tablet Take by mouth.    . mesalamine (LIALDA) 1.2 G EC tablet Take by mouth.    Marland Kitchen omeprazole (PRILOSEC) 40 MG capsule Take by mouth.    . prednisoLONE acetate (PRED  FORTE) 1 % ophthalmic suspension Apply to eye.    . Certolizumab Pegol 2 X 200 MG/ML KIT Inject into the skin.     No current facility-administered medications for this visit.    Review of Systems:  GENERAL:  No fevers or sweats.  Weight normally 121 pounds (lowest 116 pounds). PERFORMANCE STATUS (ECOG):  1 HEENT:  Teeth affected.  No visual changes, runny nose, sore throat, mouth sores or tenderness. Lungs: No shortness of breath or cough.  No hemoptysis. Cardiac:  No chest pain, palpitations, orthopnea, or PND. GI:  Eating more. Formed daily bowel movements.  No nausea, vomiting, diarrhea, constipation, melena or hematochezia. GU:  No urgency, frequency, dysuria, or hematuria. Musculoskeletal:  No back pain.  No joint pain.  No muscle tenderness. Extremities:  No pain or swelling. Skin:  No rashes or skin changes. Neuro:  No headache, numbness or weakness, balance or coordination issues. Endocrine:  No diabetes, thyroid issues, hot flashes or night sweats. Psych:  No mood changes, depression or anxiety. Pain:  No focal pain. Review of systems:  All other systems reviewed and found to be negative.  Physical Exam: Blood pressure 106/69, pulse 77, temperature 97.7 F (36.5 C), temperature source Tympanic, resp. rate 18, height _0  (1.626 m), weight 122 lb 9.2 oz (55.6 kg). GENERAL:  Thin gentleman  sitting comfortably in the exam room in no acute distress. MENTAL STATUS:  Alert and oriented to person, place and time. HEAD:  Dark curly hair.  Temporal wasting.  Normocephalic, atraumatic, face symmetric, no Cushingoid features. EYES:  Pupils equal round and reactive to light and accomodation.  No conjunctivitis or scleral icterus. ENT:  Oropharynx clear without lesion.  Edentulous.  Tongue normal. Mucous membranes moist.  RESPIRATORY:  Clear to auscultation without rales, wheezes or rhonchi. CARDIOVASCULAR:  Regular rate and rhythm without murmur, rub or gallop. ABDOMEN:  Well healed  midline incision.  Soft, non-tender, with active bowel sounds, and no hepatosplenomegaly.  No masses. SKIN:  No rashes, ulcers or lesions. EXTREMITIES: No edema, no skin discoloration or tenderness.  No palpable cords. LYMPH NODES: No palpable cervical, supraclavicular, axillary or inguinal adenopathy  NEUROLOGICAL: Unremarkable. PSYCH:  Appropriate.  Appointment on 02/03/2016  Component Date Value Ref Range Status  . WBC 02/03/2016 7.4  3.8 - 10.6 K/uL Final  . RBC 02/03/2016 5.95* 4.40 - 5.90 MIL/uL Final  . Hemoglobin 02/03/2016 14.2  13.0 - 18.0 g/dL Final  . HCT 02/03/2016 44.8  40.0 - 52.0 % Final  . MCV 02/03/2016 75.3* 80.0 - 100.0 fL Final  . MCH 02/03/2016 23.9* 26.0 - 34.0 pg Final  . MCHC 02/03/2016 31.7* 32.0 - 36.0 g/dL Final  . RDW 02/03/2016 20.2* 11.5 - 14.5 % Final  . Platelets 02/03/2016 220  150 - 440 K/uL Final  . Neutrophils Relative % 02/03/2016 67   Final  . Neutro Abs 02/03/2016 4.9  1.4 - 6.5 K/uL Final  . Lymphocytes Relative 02/03/2016 22   Final  . Lymphs Abs 02/03/2016 1.6  1.0 - 3.6 K/uL Final  . Monocytes Relative 02/03/2016 8   Final  . Monocytes Absolute 02/03/2016 0.6  0.2 - 1.0 K/uL Final  . Eosinophils Relative 02/03/2016 2   Final  . Eosinophils Absolute 02/03/2016 0.2  0 - 0.7 K/uL Final  . Basophils Relative 02/03/2016 1   Final  . Basophils Absolute 02/03/2016 0.0  0 - 0.1 K/uL Final    Assessment:  Daniel Bird is a 25 y.o. male with Crohn's disease and recurrent iron deficiency anemia.  He received IV iron 3-4 times in 2015 while living in [place].  Colonoscopy in 2015 was negative per patient report.  Symptomatically, he has no complaints today.  Exam is unremarkable. Diet is good.  He denies any bowel issues. Hgb 14.2, Hematocrit 44.8.  RBCs are microcytic.  Ferritin is 11.    Plan: 1. Labs today:  CBC with diff, ferritin 2. RTC tomorrow for feraheme infusion. 3. RTC in 3 months for MD assessment and labs at [facility] (CBC with  diff, ferritin) +/- Feraheme.   Evlyn Kanner, NP  02/03/2016, 2:41 PM

## 2016-02-04 ENCOUNTER — Inpatient Hospital Stay: Payer: BLUE CROSS/BLUE SHIELD

## 2016-02-04 VITALS — BP 114/76 | HR 77 | Temp 97.0°F | Resp 18

## 2016-02-04 DIAGNOSIS — D509 Iron deficiency anemia, unspecified: Secondary | ICD-10-CM | POA: Diagnosis not present

## 2016-02-04 MED ORDER — SODIUM CHLORIDE 0.9 % IV SOLN
510.0000 mg | Freq: Once | INTRAVENOUS | Status: AC
  Administered 2016-02-04: 510 mg via INTRAVENOUS
  Filled 2016-02-04: qty 17

## 2016-02-04 MED ORDER — SODIUM CHLORIDE 0.9 % IV SOLN
Freq: Once | INTRAVENOUS | Status: AC
  Administered 2016-02-04: 14:00:00 via INTRAVENOUS
  Filled 2016-02-04: qty 1000

## 2016-04-27 ENCOUNTER — Encounter: Payer: Self-pay | Admitting: Hematology and Oncology

## 2016-05-02 ENCOUNTER — Encounter: Payer: Self-pay | Admitting: Hematology and Oncology

## 2016-05-02 ENCOUNTER — Inpatient Hospital Stay: Payer: BLUE CROSS/BLUE SHIELD | Attending: Hematology and Oncology | Admitting: Hematology and Oncology

## 2016-05-02 ENCOUNTER — Other Ambulatory Visit: Payer: Self-pay | Admitting: Hematology and Oncology

## 2016-05-02 VITALS — BP 111/78 | HR 82 | Temp 95.3°F | Resp 17 | Ht 64.0 in | Wt 116.1 lb

## 2016-05-02 DIAGNOSIS — K5090 Crohn's disease, unspecified, without complications: Secondary | ICD-10-CM | POA: Diagnosis not present

## 2016-05-02 DIAGNOSIS — D509 Iron deficiency anemia, unspecified: Secondary | ICD-10-CM | POA: Insufficient documentation

## 2016-05-02 DIAGNOSIS — K219 Gastro-esophageal reflux disease without esophagitis: Secondary | ICD-10-CM | POA: Insufficient documentation

## 2016-05-02 DIAGNOSIS — R634 Abnormal weight loss: Secondary | ICD-10-CM | POA: Insufficient documentation

## 2016-05-02 NOTE — Progress Notes (Signed)
Pt reports no changes since last visit.  Pt reports he Hasn't really felt anemic

## 2016-05-02 NOTE — Progress Notes (Signed)
[facility] day:  05/02/2016   Chief Complaint: Daniel Bird is a 25 y.o. male with Crohn's disease and recurrent iron deficiency anemia who is seen for 3 month assessment.  HPI: The patient  was last seen in the medical oncology clinic on 02/03/2016 by Georgeanne Nim, NP.  At that time, he voiced no complaints.  Exam was unremarkable.  Diet was labs.  CBC included a hematocrit of 44.8, hemoglobin 14.2.  RBCs were microcytic.  Ferritin was 11.  He received Feraheme 510 mg IV on 05/03/2016.  During the interim, he has felt "alright".  He denies any problems.  Bowel movements are normal.  He denies any melena or hematochezia.  [facility] labs on 04/27/2016 revealed a hematocrit of 49.4, hemoglobin 15.8, MCV 80, platelets 244,000, WBC 6700 with an ANC of 4400.  Ferritin was 43.   Past Medical History  Diagnosis Date  . GERD (gastroesophageal reflux disease)   . Anemia   . Crohn's disease [facility])     Past Surgical History  Procedure Laterality Date  . Appendectomy      May 2014   No family history on file.  He is an only child.  Social History:  reports that he has never smoked. He has never used smokeless tobacco. He reports that he does not drink alcohol or use illicit drugs.  The patient is accompanied by his great aunt, Vaughan Basta.  He lives with his grandmother.  He completed college.  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  Allergies:  Allergies  Allergen Reactions  . Sulfamethoxazole-Trimethoprim Anaphylaxis  . Rifampin Other (See Comments)    Flu like illness    Current Medications: Current Outpatient Prescriptions  Medication Sig Dispense Refill  . CIMZIA PREFILLED 2 X 200 MG/ML KIT   0  . mesalamine (LIALDA) 1.2 G EC tablet Take by mouth.    . Certolizumab Pegol 2 X 200 MG/ML KIT Inject into the skin.    Marland Kitchen isoniazid (NYDRAZID) 300 MG tablet Take by mouth.  Reported on 05/02/2016    . omeprazole (PRILOSEC) 40 MG capsule Take by mouth. Reported on 05/02/2016    . prednisoLONE acetate (PRED FORTE) 1 % ophthalmic suspension Apply to eye. Reported on 05/02/2016     No current facility-administered medications for this visit.    Review of Systems:  GENERAL:  Feels "alright".  No fevers or sweats.  Weight down 6 pounds.  Weight is normally 121 pounds (lowest 116 pounds-today). PERFORMANCE STATUS (ECOG):  1 HEENT:  Teeth affected.  No visual changes, runny nose, sore throat, mouth sores or tenderness. Lungs: No shortness of breath or cough.  No hemoptysis. Cardiac:  No chest pain, palpitations, orthopnea, or PND. GI:  Eating well. Normal formed daily bowel movements.  No nausea, vomiting, diarrhea, constipation, melena or hematochezia. GU:  No urgency, frequency, dysuria, or hematuria. Musculoskeletal:  No back pain.  No joint pain.  No muscle tenderness. Extremities:  No pain or swelling. Skin:  No rashes or skin changes. Neuro:  No headache, numbness or weakness, balance or coordination issues. Endocrine:  No diabetes, thyroid issues, hot flashes or night sweats. Psych:  No mood changes, depression or anxiety. Pain:  No focal pain. Review of systems:  All other systems reviewed and found to be negative.  Physical Exam: Blood pressure 111/78, pulse 82, temperature 95.3 F (35.2 C), temperature source Tympanic, resp. rate 17,  height _0  (1.626 m), weight 116 lb 1.2 oz (52.65 kg). GENERAL:  Thin gentleman sitting comfortably in the exam room in no acute distress. MENTAL STATUS:  Alert and oriented to person, place and time. HEAD:  Dark curly hair.  Temporal wasting.  Normocephalic, atraumatic, face symmetric, no Cushingoid features. EYES:  Pupils equal round and reactive to light and accomodation.  No conjunctivitis or scleral icterus. ENT:  Oropharynx clear without lesion.  Edentulous.  Tongue normal. Mucous membranes moist.  RESPIRATORY:  Clear to  auscultation without rales, wheezes or rhonchi. CARDIOVASCULAR:  Regular rate and rhythm without murmur, rub or gallop. ABDOMEN:  Well healed midline incision.  Soft, non-tender, with active bowel sounds, and no hepatosplenomegaly.  No masses. SKIN:  No rashes, ulcers or lesions. EXTREMITIES: No edema, no skin discoloration or tenderness.  No palpable cords. LYMPH NODES: No palpable cervical, supraclavicular, axillary or inguinal adenopathy  NEUROLOGICAL: Unremarkable. PSYCH:  Appropriate.  No visits with results within 3 Day(s) from this visit. Latest known visit with results is:  Appointment on 02/03/2016  Component Date Value Ref Range Status  . WBC 02/03/2016 7.4  3.8 - 10.6 K/uL Final  . RBC 02/03/2016 5.95* 4.40 - 5.90 MIL/uL Final  . Hemoglobin 02/03/2016 14.2  13.0 - 18.0 g/dL Final  . HCT 02/03/2016 44.8  40.0 - 52.0 % Final  . MCV 02/03/2016 75.3* 80.0 - 100.0 fL Final  . MCH 02/03/2016 23.9* 26.0 - 34.0 pg Final  . MCHC 02/03/2016 31.7* 32.0 - 36.0 g/dL Final  . RDW 02/03/2016 20.2* 11.5 - 14.5 % Final  . Platelets 02/03/2016 220  150 - 440 K/uL Final  . Neutrophils Relative % 02/03/2016 67   Final  . Neutro Abs 02/03/2016 4.9  1.4 - 6.5 K/uL Final  . Lymphocytes Relative 02/03/2016 22   Final  . Lymphs Abs 02/03/2016 1.6  1.0 - 3.6 K/uL Final  . Monocytes Relative 02/03/2016 8   Final  . Monocytes Absolute 02/03/2016 0.6  0.2 - 1.0 K/uL Final  . Eosinophils Relative 02/03/2016 2   Final  . Eosinophils Absolute 02/03/2016 0.2  0 - 0.7 K/uL Final  . Basophils Relative 02/03/2016 1   Final  . Basophils Absolute 02/03/2016 0.0  0 - 0.1 K/uL Final  . Ferritin 02/03/2016 11* 24 - 336 ng/mL Final    Assessment:  Daniel Bird is a 25 y.o. male with Crohn's disease and recurrent iron deficiency anemia.  He received IV iron 3-4 times in 2015 while living in [place].  Colonoscopy in 2015 was negative per patient report.  He has received Feraheme on several occasions  (08/27/2015, 11/12/2015, and 02/04/2016).  Symptomatically, he feels "alright".  Exam is unremarkable.  Diet is good.  Bowel movements are normal.  Hematocrit is 49.4.  RBCs are normocytic.  Ferritin is 43.    Plan: 1.  Review [facility] labs.  Patient not anemic.  Iron stores adequate. 2.  RTC in 3 months for MD assessment, revoew of [facility] labs (CBC with diff, ferritin) +/- Feraheme.   Lequita Asal, MD  05/02/2016, 11:44 AM

## 2016-05-24 ENCOUNTER — Encounter: Payer: Self-pay | Admitting: Hematology and Oncology

## 2016-07-25 ENCOUNTER — Encounter: Payer: Self-pay | Admitting: Hematology and Oncology

## 2016-08-01 ENCOUNTER — Inpatient Hospital Stay: Payer: BLUE CROSS/BLUE SHIELD

## 2016-08-01 ENCOUNTER — Other Ambulatory Visit: Payer: Self-pay | Admitting: Hematology and Oncology

## 2016-08-01 ENCOUNTER — Inpatient Hospital Stay: Payer: BLUE CROSS/BLUE SHIELD | Attending: Hematology and Oncology | Admitting: Hematology and Oncology

## 2016-08-01 VITALS — BP 114/73 | HR 74 | Temp 96.3°F | Resp 18 | Wt 116.2 lb

## 2016-08-01 DIAGNOSIS — Z79899 Other long term (current) drug therapy: Secondary | ICD-10-CM | POA: Diagnosis not present

## 2016-08-01 DIAGNOSIS — K219 Gastro-esophageal reflux disease without esophagitis: Secondary | ICD-10-CM

## 2016-08-01 DIAGNOSIS — D508 Other iron deficiency anemias: Secondary | ICD-10-CM

## 2016-08-01 DIAGNOSIS — K5090 Crohn's disease, unspecified, without complications: Secondary | ICD-10-CM | POA: Insufficient documentation

## 2016-08-01 DIAGNOSIS — D500 Iron deficiency anemia secondary to blood loss (chronic): Secondary | ICD-10-CM

## 2016-08-01 NOTE — Progress Notes (Addendum)
[facility] day:  08/01/16   Chief Complaint: Daniel Bird is a 25 y.o. male with Crohn's disease and recurrent iron deficiency anemia who is seen for 3 month assessment.  HPI: The patient  was last seen in the medical oncology clinic on 05/02/2016.  At that time, he felt "alright".  Exam was unremarkable.  Diet was good.  Bowel movements were normal.  Hematocrit was 49.4.  RBCs wer normocytic.  Ferritin was 43.  He did not receive any iron.  During the interim, he has felt "alright".  He denies any problems.  Bowel movements are normal.  He denies any melena or hematochezia.  [facility] labs on 07/25/2016 revealed a hematocrit of 45.9, hemoglobin 14.7, MCV 80, platelets 275,000, and WBC 9900.  Ferritin was 33.  Symptomatically, he notes that his Crohn's is "ok".  He denies any melena or hematochezia.   Past Medical History:  Diagnosis Date  . Anemia   . Crohn's disease ([facility])   . GERD (gastroesophageal reflux disease)     Past Surgical History:  Procedure Laterality Date  . APPENDECTOMY     May 2014   No family history on file.  He is an only child.  Social History:  reports that he has never smoked. He has never used smokeless tobacco. He reports that he does not drink alcohol or use drugs.  The patient is accompanied by his great aunt, Vaughan Basta.  He lives with his grandmother.  He completed college.  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  He is accompanied by his grandmother, Vaughan Basta, today.  Allergies:  Allergies  Allergen Reactions  . Sulfamethoxazole-Trimethoprim Anaphylaxis  . Rifampin Other (See Comments)    Flu like illness    Current Medications: Current Outpatient Prescriptions  Medication Sig Dispense Refill  . Certolizumab Pegol 2 X 200 MG/ML KIT Inject into the skin.    Marland Kitchen CIMZIA PREFILLED 2 X 200 MG/ML KIT   0  . isoniazid (NYDRAZID) 300 MG tablet Take by mouth.  Reported on 05/02/2016    . mesalamine (LIALDA) 1.2 G EC tablet Take by mouth.    Marland Kitchen omeprazole (PRILOSEC) 40 MG capsule Take by mouth. Reported on 05/02/2016    . prednisoLONE acetate (PRED FORTE) 1 % ophthalmic suspension Apply to eye. Reported on 05/02/2016     No current facility-administered medications for this visit.     Review of Systems:  GENERAL:  Feels "fine".  No fevers or sweats.  Weight stable.  Weight is normally 121 pounds (lowest 116 pounds-today). PERFORMANCE STATUS (ECOG):  1 HEENT:  Teeth affected.  No visual changes, runny nose, sore throat, mouth sores or tenderness. Lungs: No shortness of breath or cough.  No hemoptysis. Cardiac:  No chest pain, palpitations, orthopnea, or PND. GI:  Eating well.  Normal formed daily bowel movements.  No nausea, vomiting, diarrhea, constipation, melena or hematochezia. GU:  No urgency, frequency, dysuria, or hematuria. Musculoskeletal:  No back pain.  No joint pain.  No muscle tenderness. Extremities:  No pain or swelling. Skin:  No rashes or skin changes. Neuro:  No headache, numbness or weakness, balance or coordination issues. Endocrine:  No diabetes, thyroid issues, hot flashes or night sweats. Psych:  No mood changes, depression or anxiety. Pain:  No focal pain. Review of systems:  All other systems reviewed and found to be negative.  Physical Exam: Blood pressure 114/73, pulse 74, temperature (!) 96.3 F (  35.7 C), temperature source Tympanic, resp. rate 18, weight 116 lb 2.9 oz (52.7 kg). GENERAL:  Thin gentleman sitting comfortably in the exam room in no acute distress. MENTAL STATUS:  Alert and oriented to person, place and time. HEAD:  Dark curly hair.  Temporal wasting.  Normocephalic, atraumatic, face symmetric, no Cushingoid features. EYES:  Glasses.  Brown eyes.  Pupils equal round and reactive to light and accomodation.  No conjunctivitis or scleral icterus. ENT:  Oropharynx clear without lesion.  Dentures.  Tongue normal.  Mucous membranes moist.  RESPIRATORY:  Clear to auscultation without rales, wheezes or rhonchi. CARDIOVASCULAR:  Regular rate and rhythm without murmur, rub or gallop. ABDOMEN:  Well healed midline incision.  Soft, non-tender, with active bowel sounds, and no hepatosplenomegaly.  No masses. SKIN:  No rashes, ulcers or lesions. EXTREMITIES: No edema, no skin discoloration or tenderness.  No palpable cords. LYMPH NODES: No palpable cervical, supraclavicular, axillary or inguinal adenopathy  NEUROLOGICAL: Unremarkable. PSYCH:  Appropriate.   No visits with results within 3 Day(s) from this visit.  Latest known visit with results is:  Appointment on 02/03/2016  Component Date Value Ref Range Status  . WBC 02/03/2016 7.4  3.8 - 10.6 K/uL Final  . RBC 02/03/2016 5.95* 4.40 - 5.90 MIL/uL Final  . Hemoglobin 02/03/2016 14.2  13.0 - 18.0 g/dL Final  . HCT 02/03/2016 44.8  40.0 - 52.0 % Final  . MCV 02/03/2016 75.3* 80.0 - 100.0 fL Final  . MCH 02/03/2016 23.9* 26.0 - 34.0 pg Final  . MCHC 02/03/2016 31.7* 32.0 - 36.0 g/dL Final  . RDW 02/03/2016 20.2* 11.5 - 14.5 % Final  . Platelets 02/03/2016 220  150 - 440 K/uL Final  . Neutrophils Relative % 02/03/2016 67  % Final  . Neutro Abs 02/03/2016 4.9  1.4 - 6.5 K/uL Final  . Lymphocytes Relative 02/03/2016 22  % Final  . Lymphs Abs 02/03/2016 1.6  1.0 - 3.6 K/uL Final  . Monocytes Relative 02/03/2016 8  % Final  . Monocytes Absolute 02/03/2016 0.6  0.2 - 1.0 K/uL Final  . Eosinophils Relative 02/03/2016 2  % Final  . Eosinophils Absolute 02/03/2016 0.2  0 - 0.7 K/uL Final  . Basophils Relative 02/03/2016 1  % Final  . Basophils Absolute 02/03/2016 0.0  0 - 0.1 K/uL Final  . Ferritin 02/03/2016 11* 24 - 336 ng/mL Final    Assessment:  Daniel Bird is a 25 y.o. male with Crohn's disease and recurrent iron deficiency anemia.  He received IV iron 3-4 times in 2015 while living in [place].  Colonoscopy in 2015 was negative per patient  report.  He has received Feraheme on several occasions (08/27/2015, 11/12/2015, and 02/04/2016).  Symptomatically, he feels good.  Exam is unremarkable.  Diet is good.  Bowel movements are normal.  Hematocrit is 45.9.  RBCs are normocytic.  Ferritin is 33.    Plan: 1.  Review [facility] labs.  Patient not anemic.  Iron stores adequate. 2.  Discuss slowly drifting down ferritin.  Anticipate need for IV iron in 3 months. 3.  RTC in 3 months for MD assessment, review of [facility] labs (CBC with diff, ferritin) +/- Feraheme.   Lequita Asal, MD  08/01/2016, 11:55 AM

## 2016-08-01 NOTE — Progress Notes (Signed)
States is feeling well. Offers no complaints. Weight has been stable.

## 2016-09-25 ENCOUNTER — Encounter: Payer: Self-pay | Admitting: Hematology and Oncology

## 2016-10-19 ENCOUNTER — Encounter: Payer: Self-pay | Admitting: Hematology and Oncology

## 2016-10-31 ENCOUNTER — Encounter: Payer: Self-pay | Admitting: Hematology and Oncology

## 2016-10-31 ENCOUNTER — Inpatient Hospital Stay: Payer: BLUE CROSS/BLUE SHIELD | Attending: Hematology and Oncology | Admitting: Hematology and Oncology

## 2016-10-31 ENCOUNTER — Inpatient Hospital Stay: Payer: BLUE CROSS/BLUE SHIELD

## 2016-10-31 VITALS — BP 114/70 | HR 94 | Temp 96.3°F | Resp 18 | Wt 117.5 lb

## 2016-10-31 DIAGNOSIS — D509 Iron deficiency anemia, unspecified: Secondary | ICD-10-CM | POA: Diagnosis not present

## 2016-10-31 DIAGNOSIS — Z79899 Other long term (current) drug therapy: Secondary | ICD-10-CM | POA: Insufficient documentation

## 2016-10-31 DIAGNOSIS — K5090 Crohn's disease, unspecified, without complications: Secondary | ICD-10-CM | POA: Diagnosis not present

## 2016-10-31 DIAGNOSIS — K219 Gastro-esophageal reflux disease without esophagitis: Secondary | ICD-10-CM | POA: Diagnosis not present

## 2016-10-31 DIAGNOSIS — D500 Iron deficiency anemia secondary to blood loss (chronic): Secondary | ICD-10-CM | POA: Diagnosis not present

## 2016-10-31 DIAGNOSIS — Z882 Allergy status to sulfonamides status: Secondary | ICD-10-CM | POA: Diagnosis not present

## 2016-10-31 NOTE — Progress Notes (Signed)
[facility] [facility] day:  10/31/16   Chief Complaint: Daniel Bird is a 25 y.o. male with Crohn's disease and recurrent iron deficiency anemia who is seen for 3 month assessment.  HPI: The patient  was last seen in the medical oncology clinic on 08/01/2016.  At that time, he felt good.  Exam was unremarkable.  Diet was good.  Bowel movements were normal.  Hematocrit was 45.9.  RBCs were normocytic.  Ferritin was 33.  He did not receive any iron.  During the interim, he has felt "alright".  He denies any problems.  Bowel movements are normal.  He denies any melena or hematochezia.  Stools are formed and 3 times a day.  [facility] labs on 10/19/2016 revealed a hematocrit of 46.2, hemoglobin 14.7, MCV 80, platelets 236,000, and WBC 10,400.  Ferritin was 24.  Studies included a saturation of 19% and a TIBC of 267. Albumen was 3.3 seem with a calcium of 8.6   Past Medical History:  Diagnosis Date  . Anemia   . Crohn's disease ([place])   . GERD (gastroesophageal reflux disease)     Past Surgical History:  Procedure Laterality Date  . APPENDECTOMY     May 2014   History reviewed. No pertinent family history.  He is an only child.  Social History:  reports that he has never smoked. He has never used smokeless tobacco. He reports that he does not drink alcohol or use drugs.  The patient is accompanied by his great aunt, Vaughan Basta.  He lives with his grandmother.  He completed college.  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    Allergies:  Allergies  Allergen Reactions  . Sulfamethoxazole-Trimethoprim Anaphylaxis  . Rifampin Other (See Comments)    Flu like illness    Current Medications: Current Outpatient Prescriptions  Medication Sig Dispense Refill  . CIMZIA PREFILLED 2 X 200 MG/ML KIT   0  . isoniazid (NYDRAZID) 300 MG tablet Take by mouth. Reported on 05/02/2016    . mesalamine (LIALDA) 1.2 G EC  tablet Take by mouth.    Marland Kitchen omeprazole (PRILOSEC) 40 MG capsule Take by mouth. Reported on 05/02/2016    . prednisoLONE acetate (PRED FORTE) 1 % ophthalmic suspension Apply to eye. Reported on 05/02/2016    . Certolizumab Pegol 2 X 200 MG/ML KIT Inject into the skin.     No current facility-administered medications for this visit.     Review of Systems:  GENERAL:  Feels "alright".  No fevers or sweats.  Weight down 4 pounds.  Weight is normally 121 pounds (lowest 116 pounds). PERFORMANCE STATUS (ECOG):  1 HEENT:  Teeth affected.  No visual changes, runny nose, sore throat, mouth sores or tenderness. Lungs: No shortness of breath or cough.  No hemoptysis. Cardiac:  No chest pain, palpitations, orthopnea, or PND. GI:  Eating well.  Normal formed daily bowel movements (3x/day).  No nausea, vomiting, diarrhea, constipation, melena or hematochezia. GU:  No urgency, frequency, dysuria, or hematuria. Musculoskeletal:  No back pain.  No joint pain.  No muscle tenderness. Extremities:  No pain or swelling. Skin:  No rashes or skin changes. Neuro:  No headache, numbness or weakness, balance or coordination issues. Endocrine:  No diabetes, thyroid issues, hot flashes or night sweats. Psych:  No mood changes, depression or anxiety. Pain:  No focal pain. Review of systems:  All other systems reviewed and found to be negative.  Physical Exam: Blood pressure 114/70, pulse 94, temperature (!) 96.3 F (35.7 C), temperature source Tympanic, resp. rate 18, weight 117 lb 8.1 oz (53.3 kg). GENERAL:  Thin gentleman sitting comfortably in the exam room in no acute distress. MENTAL STATUS:  Alert and oriented to person, place and time. HEAD:  Dark curly hair.  Temporal wasting.  Normocephalic, atraumatic, face symmetric, no Cushingoid features. EYES:  Glasses.  Brown eyes.  Left eye injected.  Pupils equal round and reactive to light and accomodation.  No scleral icterus. ENT:  Oropharynx clear without lesion.   Dentures.  Tongue normal. Mucous membranes moist.  RESPIRATORY:  Clear to auscultation without rales, wheezes or rhonchi. CARDIOVASCULAR:  Regular rate and rhythm without murmur, rub or gallop. ABDOMEN:  Well healed midline incision.  Soft, non-tender, with active bowel sounds, and no hepatosplenomegaly.  No masses. SKIN:  No rashes, ulcers or lesions. EXTREMITIES: No edema, no skin discoloration or tenderness.  No palpable cords. LYMPH NODES: No palpable cervical, supraclavicular, axillary or inguinal adenopathy  NEUROLOGICAL: Unremarkable. PSYCH:  Appropriate.   No visits with results within 3 Day(s) from this visit.  Latest known visit with results is:  Appointment on 02/03/2016  Component Date Value Ref Range Status  . WBC 02/03/2016 7.4  3.8 - 10.6 K/uL Final  . RBC 02/03/2016 5.95* 4.40 - 5.90 MIL/uL Final  . Hemoglobin 02/03/2016 14.2  13.0 - 18.0 g/dL Final  . HCT 02/03/2016 44.8  40.0 - 52.0 % Final  . MCV 02/03/2016 75.3* 80.0 - 100.0 fL Final  . MCH 02/03/2016 23.9* 26.0 - 34.0 pg Final  . MCHC 02/03/2016 31.7* 32.0 - 36.0 g/dL Final  . RDW 02/03/2016 20.2* 11.5 - 14.5 % Final  . Platelets 02/03/2016 220  150 - 440 K/uL Final  . Neutrophils Relative % 02/03/2016 67  % Final  . Neutro Abs 02/03/2016 4.9  1.4 - 6.5 K/uL Final  . Lymphocytes Relative 02/03/2016 22  % Final  . Lymphs Abs 02/03/2016 1.6  1.0 - 3.6 K/uL Final  . Monocytes Relative 02/03/2016 8  % Final  . Monocytes Absolute 02/03/2016 0.6  0.2 - 1.0 K/uL Final  . Eosinophils Relative 02/03/2016 2  % Final  . Eosinophils Absolute 02/03/2016 0.2  0 - 0.7 K/uL Final  . Basophils Relative 02/03/2016 1  % Final  . Basophils Absolute 02/03/2016 0.0  0 - 0.1 K/uL Final  . Ferritin 02/03/2016 11* 24 - 336 ng/mL Final    Assessment:  Daniel Bird is a 25 y.o. male with Crohn's disease and recurrent iron deficiency anemia.  He received IV iron 3-4 times in 2015 while living in [place].  Colonoscopy in 2015 was  negative per patient report.  He has received Feraheme on several occasions (08/27/2015, 11/12/2015, and 02/04/2016).  Symptomatically, he feels alright.  Exam is unremarkable.  Bowel movements are normal.  Hematocrit is 46.2.  RBCs are normocytic.  Ferritin is 24.    Plan: 1.  Review [facility] labs.  Patient not anemic.  Iron stores slowly drifting down. 2.  Discuss need for IV iron in the next 2-3 months.  Patient to call if any fatigue or new symptoms. 3.  Discuss caloric intake and weight. 4.  [facility] labs in 2 months 5.  RTC in 4 months for MD assessment, review [facility] labs, and +/- Ferraheme.   Lequita Asal, MD  10/31/2016, 10:46 AM

## 2017-01-02 ENCOUNTER — Encounter: Payer: Self-pay | Admitting: Hematology and Oncology

## 2017-02-05 ENCOUNTER — Ambulatory Visit
Admission: RE | Admit: 2017-02-05 | Discharge: 2017-02-05 | Disposition: A | Discharge status: Home / Self Care | Payer: BLUE CROSS/BLUE SHIELD | Source: Ambulatory Visit | Attending: Gastroenterology | Admitting: Gastroenterology

## 2017-02-05 ENCOUNTER — Ambulatory Visit: Payer: BLUE CROSS/BLUE SHIELD | Admitting: Anesthesiology

## 2017-02-05 ENCOUNTER — Encounter: Payer: Self-pay | Admitting: *Deleted

## 2017-02-05 ENCOUNTER — Encounter
Admission: RE | Disposition: A | Discharge status: Home / Self Care | Payer: Self-pay | Source: Ambulatory Visit | Attending: Gastroenterology

## 2017-02-05 DIAGNOSIS — D509 Iron deficiency anemia, unspecified: Secondary | ICD-10-CM | POA: Diagnosis present

## 2017-02-05 DIAGNOSIS — K6389 Other specified diseases of intestine: Secondary | ICD-10-CM | POA: Insufficient documentation

## 2017-02-05 DIAGNOSIS — L409 Psoriasis, unspecified: Secondary | ICD-10-CM | POA: Insufficient documentation

## 2017-02-05 DIAGNOSIS — K269 Duodenal ulcer, unspecified as acute or chronic, without hemorrhage or perforation: Secondary | ICD-10-CM | POA: Diagnosis not present

## 2017-02-05 DIAGNOSIS — K2980 Duodenitis without bleeding: Secondary | ICD-10-CM | POA: Insufficient documentation

## 2017-02-05 DIAGNOSIS — Z882 Allergy status to sulfonamides status: Secondary | ICD-10-CM | POA: Insufficient documentation

## 2017-02-05 DIAGNOSIS — Z9049 Acquired absence of other specified parts of digestive tract: Secondary | ICD-10-CM | POA: Diagnosis not present

## 2017-02-05 DIAGNOSIS — R7611 Nonspecific reaction to tuberculin skin test without active tuberculosis: Secondary | ICD-10-CM | POA: Diagnosis not present

## 2017-02-05 DIAGNOSIS — K219 Gastro-esophageal reflux disease without esophagitis: Secondary | ICD-10-CM | POA: Insufficient documentation

## 2017-02-05 DIAGNOSIS — K5090 Crohn's disease, unspecified, without complications: Secondary | ICD-10-CM | POA: Diagnosis not present

## 2017-02-05 DIAGNOSIS — Q438 Other specified congenital malformations of intestine: Secondary | ICD-10-CM | POA: Insufficient documentation

## 2017-02-05 DIAGNOSIS — Z79899 Other long term (current) drug therapy: Secondary | ICD-10-CM | POA: Insufficient documentation

## 2017-02-05 DIAGNOSIS — K6289 Other specified diseases of anus and rectum: Secondary | ICD-10-CM | POA: Insufficient documentation

## 2017-02-05 DIAGNOSIS — K2950 Unspecified chronic gastritis without bleeding: Secondary | ICD-10-CM | POA: Insufficient documentation

## 2017-02-05 HISTORY — DX: Psoriasis, unspecified: L40.9

## 2017-02-05 HISTORY — DX: Dermatitis, unspecified: L30.9

## 2017-02-05 HISTORY — DX: Allergy, unspecified, initial encounter: T78.40XA

## 2017-02-05 HISTORY — DX: Unspecified hemorrhoids: K64.9

## 2017-02-05 HISTORY — PX: ESOPHAGOGASTRODUODENOSCOPY: SHX5428

## 2017-02-05 HISTORY — PX: COLONOSCOPY WITH PROPOFOL: SHX5780

## 2017-02-05 SURGERY — EGD (ESOPHAGOGASTRODUODENOSCOPY)
Anesthesia: General

## 2017-02-05 MED ORDER — PROPOFOL 500 MG/50ML IV EMUL
INTRAVENOUS | Status: DC | PRN
  Administered 2017-02-05: 160 ug/kg/min via INTRAVENOUS

## 2017-02-05 MED ORDER — LIDOCAINE HCL (PF) 2 % IJ SOLN
INTRAMUSCULAR | Status: AC
  Filled 2017-02-05: qty 2

## 2017-02-05 MED ORDER — PHENYLEPHRINE HCL 10 MG/ML IJ SOLN
INTRAMUSCULAR | Status: AC
  Filled 2017-02-05: qty 1

## 2017-02-05 MED ORDER — PHENYLEPHRINE HCL 10 MG/ML IJ SOLN
INTRAMUSCULAR | Status: DC | PRN
  Administered 2017-02-05 (×15): 100 ug via INTRAVENOUS

## 2017-02-05 MED ORDER — SODIUM CHLORIDE 0.9 % IV SOLN
INTRAVENOUS | Status: DC
  Administered 2017-02-05: 1000 mL via INTRAVENOUS

## 2017-02-05 MED ORDER — PROPOFOL 500 MG/50ML IV EMUL
INTRAVENOUS | Status: AC
  Filled 2017-02-05: qty 50

## 2017-02-05 MED ORDER — FENTANYL CITRATE (PF) 100 MCG/2ML IJ SOLN
INTRAMUSCULAR | Status: AC
  Filled 2017-02-05: qty 2

## 2017-02-05 MED ORDER — PROPOFOL 10 MG/ML IV BOLUS
INTRAVENOUS | Status: DC | PRN
  Administered 2017-02-05 (×2): 50 mg via INTRAVENOUS

## 2017-02-05 MED ORDER — SODIUM CHLORIDE 0.9 % IV SOLN: INTRAVENOUS | Status: DC

## 2017-02-05 MED ORDER — FENTANYL CITRATE (PF) 100 MCG/2ML IJ SOLN
INTRAMUSCULAR | Status: DC | PRN
  Administered 2017-02-05 (×2): 25 ug via INTRAVENOUS
  Administered 2017-02-05: 50 ug via INTRAVENOUS

## 2017-02-05 NOTE — Transfer of Care (Signed)
Immediate Anesthesia Transfer of Care Note  Patient: Daniel Bird  Procedure(s) Performed: Procedure(s): ESOPHAGOGASTRODUODENOSCOPY (EGD) (N/A) COLONOSCOPY WITH PROPOFOL (N/A)  Patient Location: PACU  Anesthesia Type:General  Level of Consciousness: sedated and patient cooperative  Airway & Oxygen Therapy: Patient Spontanous Breathing and Patient connected to nasal cannula oxygen  Post-op Assessment: Report given to RN and Post -op Vital signs reviewed and stable  Post vital signs: Reviewed and stable  Last Vitals:  Vitals:   02/05/17 1208 02/05/17 1406  BP: 113/76 (!) 90/50  Pulse: 82 68  Resp: 20 18  Temp: 36.2 C (!) 35.9 C    Last Pain:  Vitals:   02/05/17 1406  TempSrc: Tympanic         Complications: No apparent anesthesia complications

## 2017-02-05 NOTE — Anesthesia Preprocedure Evaluation (Signed)
Anesthesia Evaluation  Patient identified by MRN, date of birth, ID band Patient awake    Reviewed: Allergy & Precautions, NPO status , Patient's Chart, lab work & pertinent test results  History of Anesthesia Complications Negative for: history of anesthetic complications  Airway Mallampati: II  TM Distance: >3 FB Neck ROM: Full    Dental  (+) Edentulous Upper, Edentulous Lower   Pulmonary neg pulmonary ROS, neg sleep apnea, neg COPD,    breath sounds clear to auscultation- rhonchi (-) wheezing      Cardiovascular Exercise Tolerance: Good (-) hypertension(-) CAD and (-) Past MI  Rhythm:Regular Rate:Normal - Systolic murmurs and - Diastolic murmurs    Neuro/Psych negative neurological ROS  negative psych ROS   GI/Hepatic Neg liver ROS, GERD  ,crohns disease    Endo/Other  negative endocrine ROSneg diabetes  Renal/GU negative Renal ROS     Musculoskeletal negative musculoskeletal ROS (+)   Abdominal (+) - obese,   Peds  Hematology  (+) anemia ,   Anesthesia Other Findings   Reproductive/Obstetrics                             Anesthesia Physical Anesthesia Plan  ASA: II  Anesthesia Plan: General   Post-op Pain Management:    Induction: Intravenous  Airway Management Planned: Natural Airway  Additional Equipment:   Intra-op Plan:   Post-operative Plan:   Informed Consent: I have reviewed the patients History and Physical, chart, labs and discussed the procedure including the risks, benefits and alternatives for the proposed anesthesia with the patient or authorized representative who has indicated his/her understanding and acceptance.   Dental advisory given  Plan Discussed with: CRNA and Anesthesiologist  Anesthesia Plan Comments:         Anesthesia Quick Evaluation

## 2017-02-05 NOTE — Anesthesia Post-op Follow-up Note (Cosign Needed)
Anesthesia QCDR form completed.        

## 2017-02-05 NOTE — Op Note (Signed)
[facility] Gastroenterology Patient Name: Daniel Bird Procedure Date: 02/05/2017 12:17 PM MRN: [id number] Account #: [id number] Date of Birth: [birth year] Admit Type: Outpatient Age: 26 Room: [facility] ENDO ROOM 3 Gender: Male Note Status: Finalized Procedure:            Colonoscopy Indications:          Personal history of Crohn's disease Providers:            Lollie Sails, MD Referring MD:         Irven Easterly. Kary Kos, MD (Referring MD) Medicines:            Monitored Anesthesia Care Complications:        No immediate complications. Procedure:            Pre-Anesthesia Assessment:                       - ASA Grade Assessment: II - A patient with mild                        systemic disease.                       After obtaining informed consent, the colonoscope was                        passed under direct vision. Throughout the procedure,                        the patient's blood pressure, pulse, and oxygen                        saturations were monitored continuously. The                        Colonoscope was introduced through the anus and                        advanced to the the ileocolonic anastomosis. The                        colonoscopy was unusually difficult due to a tortuous                        colon. Successful completion of the procedure was aided                        by changing the patient to a supine position, changing                        the patient to a prone position, using manual pressure                        and withdrawing and reinserting the scope. The patient                        tolerated the procedure well. The quality of the bowel                        preparation was good. Findings:      The colon appeared grossly normal throughout with  only scattered/small       areas of minimal erythema. The ileocolonic anastomosis is easily       appreciated with muld inflammation at the site. The Ileal opening is   stenotic and I was unable to advance the scope into the neoterminal       ileum. Multiple biopsies were taken of the ileal side of of the       anastomosis and placed into a separate jar. Multiple levels of colon       biopsy were taken in a random fashion from the following--ascending       colon adjacent to theanastomosis/neocecum, right colon, distal       transverse, colon about 50 cm, sigmoid colon rectum, each of these       placed into a separate biopsy speciman jar.      There is peri-anal thickening of the skin, around the orifice without       evidence of fissure or fistula. Digital rectal exam otherwise normal. Impression:           - No specimens collected. Recommendation:       - Discharge patient to home.                       - Continue present medications. Procedure Code(s):    --- Professional ---                       [phone], Colonoscopy, flexible; diagnostic, including                        collection of specimen(s) by brushing or washing, when                        performed (separate procedure) Diagnosis Code(s):    --- Professional ---                       Z87.19, Personal history of other diseases of the                        digestive system CPT copyright 2016 American Medical Association. All rights reserved. The codes documented in this report are preliminary and upon coder review may  be revised to meet current compliance requirements. Lollie Sails, MD 02/05/2017 2:13:14 PM This report has been signed electronically. Number of Addenda: 0 Note Initiated On: 02/05/2017 12:17 PM Scope Withdrawal Time: 0 hours 19 minutes 21 seconds  Total Procedure Duration: 0 hours 43 minutes 38 seconds       [facility]

## 2017-02-05 NOTE — H&P (Addendum)
Outpatient short stay form Pre-procedure 02/05/2017 12:25 PM Lollie Sails MD  Primary Physician: Dr. Maryland Pink, Dr. Nolon Stalls  Reason for visit:  EGD and colonoscopy  History of present illness:  Patient is a 26 year old male presenting today as above. He has a history of Crohn's disease diagnosed about the age of 66. He has been on some Z a for number of years which in combination with only Aldactone and a low-dose budesonide seems to be keeping his area he has had a terminal ileectomy/partial right colectomy. He has been treated for iron deficiency with iron infusions. He currently denies any nausea vomiting or abdominal pain.  There are no bloody stools. He does have diarrhea perhaps 1 day weekly. He tolerated his prep well. He takes no aspirin or blood thinning agents.  It is of note that he has been treated for latent TB with isoniazid. He has not taken medication for about a year.  Current Facility-Administered Medications:  .  0.9 %  sodium chloride infusion, , Intravenous, Continuous, Lollie Sails, MD .  0.9 %  sodium chloride infusion, , Intravenous, Continuous, Lollie Sails, MD  Prescriptions Prior to Admission  Medication Sig Dispense Refill Last Dose  . fluticasone (FLONASE) 50 MCG/ACT nasal spray Place 1 spray into both nostrils 2 (two) times daily.     . iron dextran complex 1,000 mg in sodium chloride 0.9 % 500 mL Inject 1,000 mg into the vein every 3 (three) months.     . Certolizumab Pegol 2 X 200 MG/ML KIT Inject into the skin.   Taking  . CIMZIA PREFILLED 2 X 200 MG/ML KIT   0 Taking  . mesalamine (LIALDA) 1.2 G EC tablet Take by mouth.   Taking  . omeprazole (PRILOSEC) 40 MG capsule Take by mouth. Reported on 05/02/2016   Taking  . prednisoLONE acetate (PRED FORTE) 1 % ophthalmic suspension Apply to eye. Reported on 05/02/2016   Taking  . [DISCONTINUED] isoniazid (NYDRAZID) 300 MG tablet Take by mouth. Reported on 05/02/2016   Taking     Allergies   Allergen Reactions  . Sulfamethoxazole-Trimethoprim Anaphylaxis  . Rifampin Other (See Comments)    Flu like illness     Past Medical History:  Diagnosis Date  . Allergic state   . Anemia    IRON DEFICIENCY ANEMIA  . Crohn's disease ([place])   . Eczema   . GERD (gastroesophageal reflux disease)   . Hemorrhoids   . Psoriasis     Review of systems:      Physical Exam    Heart and lungs: Regular rate and rhythm without rub or gallop lungs are bilaterally clear    HEENT: Normocephalic atraumatic eyes are anicteric    Other:     Pertinant exam for procedure: Soft nontender nondistended bowel sounds positive normoactive    Planned proceedures: EGD, colonoscopy and indicated procedures. I have discussed the risks benefits and complications of procedures to include not limited to bleeding, infection, perforation and the risk of sedation and the patient wishes to proceed.    Lollie Sails, MD Gastroenterology 02/05/2017  12:25 PM

## 2017-02-05 NOTE — Op Note (Signed)
[facility] Gastroenterology Patient Name: Daniel Bird Procedure Date: 02/05/2017 12:18 PM MRN: [id number] Account #: [id number] Date of Birth: September 09, 1991 Admit Type: Outpatient Age: 26 Room: [facility] ENDO ROOM 3 Gender: Male Note Status: Finalized Procedure:            Upper GI endoscopy Indications:          Iron deficiency anemia, Crohn's disease Providers:            Lollie Sails, MD Referring MD:         Irven Easterly. Kary Kos, MD (Referring MD) Medicines:            Monitored Anesthesia Care Complications:        No immediate complications. Procedure:            Pre-Anesthesia Assessment:                       - ASA Grade Assessment: II - A patient with mild                        systemic disease.                       After obtaining informed consent, the endoscope was                        passed under direct vision. Throughout the procedure,                        the patient's blood pressure, pulse, and oxygen                        saturations were monitored continuously. The Endoscope                        was introduced through the mouth, and advanced to the                        third part of duodenum. The upper GI endoscopy was                        accomplished without difficulty. The patient tolerated                        the procedure well. Findings:      The examined esophagus was normal. Biopsies were taken with a cold       forceps for histology at the GE junction.      Diffuse mild inflammation characterized by congestion (edema) and       erythema was found in the gastric body and in the gastric antrum.       Biopsies were taken with a cold forceps for histology.      Multiple 1 to 3 mm mucosal papules (nodules) with no bleeding and with       central umbilication/depression were found in the gastric antrum.       Biopsies were taken with a cold forceps for histology.      Multiple non-bleeding cratered duodenal ulcers with white  base were       found scattered in the second portion of the duodenum and in the third       portion of the  duodenum. The largest lesion was 2-3 mm in largest       dimension. Biopsies were taken with a cold forceps for histology.      The cardia and gastric fundus were normal on retroflexion. Impression:           - Normal esophagus. Biopsied.                       - Gastritis. Biopsied.                       - Multiple mucosal papules (nodules) found in the                        stomach. Biopsied.                       - Multiple non-bleeding duodenal ulcers with white                        base. Biopsied. Recommendation:       - Perform a colonoscopy today. Procedure Code(s):    --- Professional ---                       [phone], Esophagogastroduodenoscopy, flexible, transoral;                        with biopsy, single or multiple Diagnosis Code(s):    --- Professional ---                       K29.70, Gastritis, unspecified, without bleeding                       K31.89, Other diseases of stomach and duodenum                       K26.9, Duodenal ulcer, unspecified as acute or chronic,                        without hemorrhage or perforation                       D50.9, Iron deficiency anemia, unspecified                       K50.90, Crohn's disease, unspecified, without                        complications CPT copyright 2016 American Medical Association. All rights reserved. The codes documented in this report are preliminary and upon coder review may  be revised to meet current compliance requirements. Lollie Sails, MD 02/05/2017 1:15:43 PM This report has been signed electronically. Number of Addenda: 0 Note Initiated On: 02/05/2017 12:18 PM      [facility]

## 2017-02-05 NOTE — Anesthesia Postprocedure Evaluation (Signed)
Anesthesia Post Note  Patient: [name]  Procedure(s) Performed: Procedure(s) (LRB): ESOPHAGOGASTRODUODENOSCOPY (EGD) (N/A) COLONOSCOPY WITH PROPOFOL (N/A)  Patient location during evaluation: Endoscopy Anesthesia Type: General Level of consciousness: awake and alert and oriented Pain management: pain level controlled Vital Signs Assessment: post-procedure vital signs reviewed and stable Respiratory status: spontaneous breathing, nonlabored ventilation and respiratory function stable Cardiovascular status: blood pressure returned to baseline and stable Postop Assessment: no signs of nausea or vomiting Anesthetic complications: no     Last Vitals:  Vitals:   02/05/17 1406 02/05/17 1416  BP: (!) 90/50 102/69  Pulse: 68 (!) 52  Resp: 18 (!) 21  Temp: (!) 35.9 C     Last Pain:  Vitals:   02/05/17 1406  TempSrc: Tympanic                 Pria Klosinski

## 2017-02-06 ENCOUNTER — Encounter: Payer: Self-pay | Admitting: Gastroenterology

## 2017-02-08 LAB — SURGICAL PATHOLOGY

## 2017-02-27 ENCOUNTER — Inpatient Hospital Stay: Payer: BLUE CROSS/BLUE SHIELD

## 2017-02-27 ENCOUNTER — Inpatient Hospital Stay: Payer: BLUE CROSS/BLUE SHIELD | Admitting: Hematology and Oncology

## 2017-03-01 ENCOUNTER — Encounter: Payer: Self-pay | Admitting: Hematology and Oncology

## 2017-03-02 ENCOUNTER — Encounter: Payer: Self-pay | Admitting: Hematology and Oncology

## 2017-03-06 ENCOUNTER — Telehealth: Payer: Self-pay | Admitting: *Deleted

## 2017-03-06 NOTE — Telephone Encounter (Signed)
Called patient to discuss his lab values with him, he reports that he has been feeling good no new issues to report.  Patient informed that Dr. Mike Gip would like to give him IV iron and he is willing to do so.  Scheduling to notify him of available appointment.

## 2017-03-08 ENCOUNTER — Inpatient Hospital Stay: Payer: BLUE CROSS/BLUE SHIELD

## 2017-03-08 ENCOUNTER — Inpatient Hospital Stay: Payer: BLUE CROSS/BLUE SHIELD | Admitting: Hematology and Oncology

## 2017-03-15 ENCOUNTER — Other Ambulatory Visit
Admission: RE | Admit: 2017-03-15 | Discharge: 2017-03-15 | Disposition: A | Discharge status: Home / Self Care | Payer: BLUE CROSS/BLUE SHIELD | Source: Ambulatory Visit | Attending: Gastroenterology | Admitting: Gastroenterology

## 2017-03-15 DIAGNOSIS — D500 Iron deficiency anemia secondary to blood loss (chronic): Secondary | ICD-10-CM | POA: Diagnosis not present

## 2017-03-20 ENCOUNTER — Inpatient Hospital Stay: Payer: BLUE CROSS/BLUE SHIELD | Attending: Hematology and Oncology

## 2017-03-20 ENCOUNTER — Other Ambulatory Visit: Payer: Self-pay | Admitting: Hematology and Oncology

## 2017-03-20 VITALS — BP 108/68 | HR 72 | Temp 96.7°F | Resp 16

## 2017-03-20 DIAGNOSIS — D500 Iron deficiency anemia secondary to blood loss (chronic): Secondary | ICD-10-CM | POA: Insufficient documentation

## 2017-03-20 DIAGNOSIS — K5090 Crohn's disease, unspecified, without complications: Secondary | ICD-10-CM | POA: Insufficient documentation

## 2017-03-20 MED ORDER — SODIUM CHLORIDE 0.9 % IV SOLN
510.0000 mg | Freq: Once | INTRAVENOUS | Status: AC
  Administered 2017-03-20: 510 mg via INTRAVENOUS
  Filled 2017-03-20: qty 17

## 2017-03-20 MED ORDER — SODIUM CHLORIDE 0.9 % IV SOLN
Freq: Once | INTRAVENOUS | Status: AC
  Administered 2017-03-20: 09:00:00 via INTRAVENOUS
  Filled 2017-03-20: qty 1000

## 2017-03-23 LAB — MISCELLANEOUS TEST: Miscellaneous Test: 6260

## 2017-04-05 ENCOUNTER — Encounter: Payer: Self-pay | Admitting: Emergency Medicine

## 2017-04-05 ENCOUNTER — Emergency Department: Payer: BLUE CROSS/BLUE SHIELD

## 2017-04-05 ENCOUNTER — Inpatient Hospital Stay
Admission: EM | Admit: 2017-04-05 | Discharge: 2017-04-07 | DRG: 387 | Disposition: A | Discharge status: Home / Self Care | Payer: BLUE CROSS/BLUE SHIELD | Attending: Internal Medicine | Admitting: Internal Medicine

## 2017-04-05 DIAGNOSIS — D509 Iron deficiency anemia, unspecified: Secondary | ICD-10-CM | POA: Diagnosis present

## 2017-04-05 DIAGNOSIS — R109 Unspecified abdominal pain: Secondary | ICD-10-CM

## 2017-04-05 DIAGNOSIS — K219 Gastro-esophageal reflux disease without esophagitis: Secondary | ICD-10-CM | POA: Diagnosis present

## 2017-04-05 DIAGNOSIS — K50918 Crohn's disease, unspecified, with other complication: Principal | ICD-10-CM | POA: Diagnosis present

## 2017-04-05 DIAGNOSIS — L409 Psoriasis, unspecified: Secondary | ICD-10-CM | POA: Diagnosis present

## 2017-04-05 DIAGNOSIS — R112 Nausea with vomiting, unspecified: Secondary | ICD-10-CM

## 2017-04-05 DIAGNOSIS — K50919 Crohn's disease, unspecified, with unspecified complications: Secondary | ICD-10-CM

## 2017-04-05 DIAGNOSIS — R1084 Generalized abdominal pain: Secondary | ICD-10-CM

## 2017-04-05 DIAGNOSIS — R197 Diarrhea, unspecified: Secondary | ICD-10-CM

## 2017-04-05 DIAGNOSIS — D72828 Other elevated white blood cell count: Secondary | ICD-10-CM | POA: Diagnosis present

## 2017-04-05 DIAGNOSIS — K529 Noninfective gastroenteritis and colitis, unspecified: Secondary | ICD-10-CM | POA: Diagnosis present

## 2017-04-05 LAB — C DIFFICILE QUICK SCREEN W PCR REFLEX
C DIFFICILE (CDIFF) TOXIN: NEGATIVE
C Diff antigen: NEGATIVE
C Diff interpretation: NOT DETECTED

## 2017-04-05 LAB — URINALYSIS, COMPLETE (UACMP) WITH MICROSCOPIC
Bacteria, UA: NONE SEEN
Bilirubin Urine: NEGATIVE
Glucose, UA: NEGATIVE mg/dL
Hgb urine dipstick: NEGATIVE
KETONES UR: 20 mg/dL — AB
Leukocytes, UA: NEGATIVE
Nitrite: NEGATIVE
PH: 5 (ref 5.0–8.0)
Protein, ur: 30 mg/dL — AB
Specific Gravity, Urine: 1.031 — ABNORMAL HIGH (ref 1.005–1.030)

## 2017-04-05 LAB — COMPREHENSIVE METABOLIC PANEL
ALBUMIN: 4 g/dL (ref 3.5–5.0)
ALK PHOS: 83 U/L (ref 38–126)
ALT: 12 U/L — AB (ref 17–63)
AST: 16 U/L (ref 15–41)
Anion gap: 8 (ref 5–15)
BILIRUBIN TOTAL: 0.7 mg/dL (ref 0.3–1.2)
BUN: 11 mg/dL (ref 6–20)
CALCIUM: 9.2 mg/dL (ref 8.9–10.3)
CO2: 24 mmol/L (ref 22–32)
CREATININE: 1.14 mg/dL (ref 0.61–1.24)
Chloride: 103 mmol/L (ref 101–111)
GFR calc Af Amer: >60 mL/min (ref >60)
GLUCOSE: 98 mg/dL (ref 65–99)
Potassium: 4 mmol/L (ref 3.5–5.1)
Sodium: 135 mmol/L (ref 135–145)
TOTAL PROTEIN: 8.3 g/dL — AB (ref 6.5–8.1)

## 2017-04-05 LAB — CBC
HCT: 52.6 % — ABNORMAL HIGH (ref 40.0–52.0)
Hemoglobin: 16.6 g/dL (ref 13.0–18.0)
MCH: 24.8 pg — AB (ref 26.0–34.0)
MCHC: 31.5 g/dL — ABNORMAL LOW (ref 32.0–36.0)
MCV: 78.8 fL — AB (ref 80.0–100.0)
PLATELETS: 257 10*3/uL (ref 150–440)
RBC: 6.68 MIL/uL — AB (ref 4.40–5.90)
RDW: 15.4 % — AB (ref 11.5–14.5)
WBC: 18.6 10*3/uL — ABNORMAL HIGH (ref 3.8–10.6)

## 2017-04-05 LAB — GASTROINTESTINAL PANEL BY PCR, STOOL (REPLACES STOOL CULTURE)

## 2017-04-05 LAB — C-REACTIVE PROTEIN: CRP: 4.7 mg/dL — AB (ref <1.0)

## 2017-04-05 LAB — LIPASE, BLOOD: Lipase: 14 U/L (ref 11–51)

## 2017-04-05 LAB — SEDIMENTATION RATE: Sed Rate: 1 mm/hr (ref 0–15)

## 2017-04-05 MED ORDER — IOPAMIDOL (ISOVUE-300) INJECTION 61%
100.0000 mL | Freq: Once | INTRAVENOUS | Status: AC | PRN
  Administered 2017-04-05: 100 mL via INTRAVENOUS

## 2017-04-05 MED ORDER — MESALAMINE 1.2 G PO TBEC
1.2000 g | DELAYED_RELEASE_TABLET | Freq: Every day | ORAL | Status: DC
  Administered 2017-04-05 – 2017-04-07 (×3): 1.2 g via ORAL
  Filled 2017-04-05 (×3): qty 1

## 2017-04-05 MED ORDER — ACETAMINOPHEN 650 MG RE SUPP: 650.0000 mg | Freq: Four times a day (QID) | RECTAL | Status: DC | PRN

## 2017-04-05 MED ORDER — METRONIDAZOLE IN NACL 5-0.79 MG/ML-% IV SOLN
500.0000 mg | Freq: Three times a day (TID) | INTRAVENOUS | Status: DC
  Administered 2017-04-05 – 2017-04-07 (×6): 500 mg via INTRAVENOUS
  Filled 2017-04-05 (×8): qty 100

## 2017-04-05 MED ORDER — PANTOPRAZOLE SODIUM 40 MG IV SOLR
40.0000 mg | Freq: Four times a day (QID) | INTRAVENOUS | Status: DC
  Administered 2017-04-05 – 2017-04-06 (×4): 40 mg via INTRAVENOUS
  Filled 2017-04-05 (×4): qty 40

## 2017-04-05 MED ORDER — METHYLPREDNISOLONE SODIUM SUCC 40 MG IJ SOLR
15.0000 mg | Freq: Four times a day (QID) | INTRAMUSCULAR | Status: DC
  Administered 2017-04-05 – 2017-04-07 (×8): 15.2 mg via INTRAVENOUS
  Filled 2017-04-05 (×8): qty 1

## 2017-04-05 MED ORDER — CIPROFLOXACIN IN D5W 400 MG/200ML IV SOLN
400.0000 mg | Freq: Once | INTRAVENOUS | Status: AC
  Administered 2017-04-05: 400 mg via INTRAVENOUS
  Filled 2017-04-05: qty 200

## 2017-04-05 MED ORDER — KETOROLAC TROMETHAMINE 30 MG/ML IJ SOLN: 30.0000 mg | Freq: Four times a day (QID) | INTRAMUSCULAR | Status: DC | PRN

## 2017-04-05 MED ORDER — ENOXAPARIN SODIUM 40 MG/0.4ML ~~LOC~~ SOLN
40.0000 mg | SUBCUTANEOUS | Status: DC
  Administered 2017-04-05 – 2017-04-06 (×2): 40 mg via SUBCUTANEOUS
  Filled 2017-04-05 (×2): qty 0.4

## 2017-04-05 MED ORDER — METRONIDAZOLE IN NACL 5-0.79 MG/ML-% IV SOLN: 500.0000 mg | Freq: Once | INTRAVENOUS | Status: DC

## 2017-04-05 MED ORDER — ACETAMINOPHEN 325 MG PO TABS: 650.0000 mg | ORAL_TABLET | Freq: Four times a day (QID) | ORAL | Status: DC | PRN

## 2017-04-05 MED ORDER — ONDANSETRON HCL 4 MG/2ML IJ SOLN
4.0000 mg | INTRAMUSCULAR | Status: AC
  Administered 2017-04-05: 4 mg via INTRAVENOUS
  Filled 2017-04-05: qty 2

## 2017-04-05 MED ORDER — PANTOPRAZOLE SODIUM 40 MG PO TBEC
40.0000 mg | DELAYED_RELEASE_TABLET | Freq: Every day | ORAL | Status: DC
  Administered 2017-04-05: 13:00:00 40 mg via ORAL
  Filled 2017-04-05: qty 1

## 2017-04-05 MED ORDER — IOPAMIDOL (ISOVUE-300) INJECTION 61%
15.0000 mL | INTRAVENOUS | Status: AC
  Administered 2017-04-05 (×2): 15 mL via ORAL

## 2017-04-05 MED ORDER — MORPHINE SULFATE (PF) 4 MG/ML IV SOLN
4.0000 mg | Freq: Once | INTRAVENOUS | Status: AC
  Administered 2017-04-05: 4 mg via INTRAVENOUS
  Filled 2017-04-05: qty 1

## 2017-04-05 MED ORDER — CIPROFLOXACIN IN D5W 400 MG/200ML IV SOLN
400.0000 mg | Freq: Two times a day (BID) | INTRAVENOUS | Status: DC
  Administered 2017-04-05 – 2017-04-07 (×4): 400 mg via INTRAVENOUS
  Filled 2017-04-05 (×5): qty 200

## 2017-04-05 MED ORDER — ALBUTEROL SULFATE (2.5 MG/3ML) 0.083% IN NEBU: 2.5000 mg | INHALATION_SOLUTION | RESPIRATORY_TRACT | Status: DC | PRN

## 2017-04-05 MED ORDER — BUDESONIDE 3 MG PO CPEP
3.0000 mg | ORAL_CAPSULE | Freq: Every day | ORAL | Status: DC
  Filled 2017-04-05: qty 1

## 2017-04-05 MED ORDER — SODIUM CHLORIDE 0.9 % IV BOLUS (SEPSIS)
1000.0000 mL | INTRAVENOUS | Status: AC
  Administered 2017-04-05: 1000 mL via INTRAVENOUS

## 2017-04-05 MED ORDER — ADULT MULTIVITAMIN W/MINERALS CH
1.0000 | ORAL_TABLET | Freq: Every day | ORAL | Status: DC
  Administered 2017-04-05 – 2017-04-07 (×3): 1 via ORAL
  Filled 2017-04-05 (×3): qty 1

## 2017-04-05 MED ORDER — ONDANSETRON HCL 4 MG PO TABS
4.0000 mg | ORAL_TABLET | Freq: Four times a day (QID) | ORAL | Status: DC | PRN
  Administered 2017-04-05: 13:00:00 4 mg via ORAL
  Filled 2017-04-05: qty 1

## 2017-04-05 MED ORDER — ONDANSETRON HCL 4 MG/2ML IJ SOLN
4.0000 mg | Freq: Four times a day (QID) | INTRAMUSCULAR | Status: DC | PRN
Start: 2017-04-05 — End: 2017-04-07

## 2017-04-05 MED ORDER — HYDROCODONE-ACETAMINOPHEN 5-325 MG PO TABS: 1.0000 | ORAL_TABLET | ORAL | Status: DC | PRN

## 2017-04-05 NOTE — Plan of Care (Signed)
Problem: Food- and Nutrition-Related Knowledge Deficit (NB-1.1) Goal: Nutrition education Formal process to instruct or train a patient/client in a skill or to impart knowledge to help patients/clients voluntarily manage or modify food choices and eating behavior to maintain or improve health. Outcome: Completed/Met Date Met: 04/05/17 Nutrition Education Note  RD consulted for nutrition education regarding a gluten free diet.   RD provided "Celiac Disease Nutrition Therapy" and "Celiac Disease Label Reading Tips" handouts from the Academy of Nutrition and Dietetics. Reviewed patient's dietary recall. Discussed Celiac disease and the impact of eating gluten for patients with Celiac. Encouraged patient to carefully read all food labels and ingredient lists to see if food contains wheat, barley, or rye. Also reviewed hidden sources of gluten and potential sources of cross-contamination patient should avoid. Encouraged patient to speak with physician or pharmacist regarding prescription and over the counter supplements and medications patient takes to ensure they do not contain ingredients made from wheat or barley. Teach back method used.  Expect good compliance.  Body mass index is 18.88 kg/m. Pt meets criteria for normal weight based on current BMI.  Current diet order is Full Liquid Diet, patient is consuming approximately 100% of meals at this time. Labs and medications reviewed.    Stephens, MS, RD, LDN Pager: [phone] After Hours Pager: [phone]    

## 2017-04-05 NOTE — Progress Notes (Signed)
Chaplain provided an Forensic scientist and education to patient. Patient is going to read the document and he will let his nurse know if he wants to complete it.   04/05/17 1400  Clinical Encounter Type  Visited With Patient and family together  Visit Type Initial  Referral From Nurse  Consult/Referral To Chaplain  Spiritual Encounters  Spiritual Needs Literature;Brochure

## 2017-04-05 NOTE — Consult Note (Signed)
Please see full Gi consult by Ms Jacqulyn Liner. Patient with known history of Crohn's disease. AcipHex both the small bowel as well as large bowel. Patient does use Cimzia as his primary agent and has been previously on budesonide as well, previously. Patient has had 3 days of variable abdominal pain with change of bowel habits increasing diarrhea. There's been no blood in the stool. 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 No evidence of fistula or abscess or free air. Patient also has a history of iron deficiency anemia for which he is under care of hematology.  Patient has been started on Cipro and Flagyl. He was also started on removal milligrams of oral budesonide. We'll start him on a a course of IV Solu-Medrol to be transition to oral prednisone prior to discharge.  We'll follow with you. Continue full liquid diet for now.

## 2017-04-05 NOTE — Progress Notes (Signed)
Initial Nutrition Assessment  DOCUMENTATION CODES:   Non-severe (moderate) malnutrition in context of chronic illness  INTERVENTION:  Provided patient with "Celiac Disease Nutrition Therapy" and "Celiac Label Reading Tips" handouts from the Academy of [employer].  Patient very interested in gaining weight. Per his request discussed his goals for daily calorie and protein intake to promote weight gain.  Recommend chewable multivitamin with minerals daily. Discussed recommendation with patient.  NUTRITION DIAGNOSIS:   Malnutrition (Moderate) related to chronic illness (Crohn's disease, new possible Celiac disease) as evidenced by severe depletion of body fat, moderate depletions of muscle mass, mild depletion of muscle mass.  GOAL:   Patient will meet greater than or equal to 90% of their needs  MONITOR:   PO intake, Labs, Weight trends, I & O's  REASON FOR ASSESSMENT:   Consult Diet education (Gluten-Free)  ASSESSMENT:   26 year old male with PMHx of Crohn's disease (diagnosed 2011 in [place]) with perirectal disease s/p small bowel resections, GERD, iron deficiency anemia requiring IV iron, eczema, psoriases found to have Crohn's flare and evidence of celiac disease (IgA deficiency, villous blunting and increased lymphocytes in duodenum).    Spoke with patient at bedside. He reports his appetite has been poor for a few days PTA due to lack of hunger (anorexia). He reports that even with the poor appetite he was still eating 100% of three meals daily with snacks between. Patient reports some abdominal pain, nausea, and diarrhea PTA. He reports he does not take any vitamins/minerals.  Patient denies any weight loss. He reports he has been at his usual body weight range of 116-120 lbs for a long time. He would like to gain weight. Discussed that his Crohn's disease and Celiac would cause decreased absorption so with adequate management and intake of adequate calories and  protein patient would be able to gain weight and muscle.  Medications reviewed and include: ciprofloxacin, methylprednisolone 15.2 mg Q6hrs, Flagyl, pantoprazole, Zofran PRN.  Labs reviewed: none pertinent  Nutrition-Focused physical exam completed. Findings are severe fat depletion, mild-moderate muscle depletion, and no edema.   Diet Order:  Diet full liquid Room service appropriate? Yes; Fluid consistency: Thin  Skin:  Reviewed, no issues  Last BM:  Unknown  Height:   Ht Readings from Last 1 Encounters:  04/05/17 5' 6"  (1.676 m)    Weight:   Wt Readings from Last 1 Encounters:  04/05/17 117 lb (53.1 kg)    Ideal Body Weight:  59.1 kg  BMI:  Body mass index is 18.88 kg/m.  Estimated Nutritional Needs:   Kcal:  1880-2165 (MSJ x 1.3-1.5)  Protein:  65-80 grams (1.2-1.5 grams/kg)  Fluid:  1.6-1.8 L/day (30-35 ml/kg)  EDUCATION NEEDS:   Education needs addressed  Willey Blade, MS, RD, LDN Pager: [phone] After Hours Pager: [phone]

## 2017-04-05 NOTE — ED Provider Notes (Addendum)
This patient was signed out to me by Dr. Les Pou. 26 year old male with a history of Crohn's disease presenting for nausea vomiting and diarrhea, with diffuse abdominal pain. I went to reevaluate the patient and had to wake him up to continue drinking his oral contrast for CT examination. He has tolerated the first bottle of contrast w/o vomiting. He does have an elevated white blood cell count, and I'm awaiting the results of his scan for final disposition.  ----------------------------------------- 10:36 AM on 04/05/2017 -----------------------------------------  Patient does have signs of ileitis and colitis on his CT scan. Although he is feeling better we will treat him with intravenous and a bad accident admit him to the hospital at this time. Dr. Reinaldo Berber team is at the bedside evaluating the patient now will admit him to the hospitalist.   Eula Listen, MD 04/05/17 0840    Eula Listen, MD 04/05/17 1037

## 2017-04-05 NOTE — Consult Note (Signed)
Consultation  Referring Provider:     Dr Mariea Clonts Admit date 04/05/17 Consult date        04/05/17 Reason for Consultation:    Crohns Flare          HPI:   Daniel Bird is a 26 y.o. male with history of Crohns diagnosed 2011 in [place], s/p small intestine resections and perirectal disease. Moved to [facility] 2016, was on Cimzia 440m scq2w, Budesonide 660mpo qd, and lialda 3.6g/d - this was maintained as he was asymptomatic. Last EGD and colonoscopy done 02/05/17: Colonoscopy appeared grossly normal with only small amounts of erythema. The ileocolonic anastomosis (neo-TI) was intact with mild inflammation. There was some thickening of the perianal skin without fissure or fistula. Biopsies demonstrated some marked chronic active ileitis with erosion and mild chronic active proctitis. The 5 other random colon biopsies were negative for colitis. There was no dysplasia or malignancy. EGD demonstrated a normal esophagus, gastritis, gastric papules, and multiple duodenal ulcers. Biopsies demonstrated marked active chronic duodenitis with villous blunting and ulceration with increased lymphocytes. There was marked chronic active gastritis. There was no barretts, h pylori, dysplasia, or malignancy. At his follow up visit last month, patient reported he was feeling well, but he had been out of his cimzia for a couple of weeks and could no longer afford the Budesonide due to some insurance changes in coverage at the first of the year. Did still continue the lialda. We were able to get the cimzia covered again, and patient evidently started taking this. TPMT levels were assessed as 61m28m imuran has been underconsideration -however patient also has high risk genetics for, and slight marker of celiac disease in the setting of IgA deficiency- along with the villous blunting/increased lymphocytes in the duodenum-so was advised to follow a gluten free diet while we consider adding imuran or 61mp12mPatient reports today that he had  sudden onset of periumbilical abdominal pain yesterday while working at [employer].  Symptoms associated with bloating, NVD, and hot flashes. No objective fever. Denies rectal drainage/pain. Denies NSAIDS and tobacco. Denies new medications, diet changes. Possibly may have been exposed to illness at work. Denies melena/hematochezia, and all further GI complaints.  Medicationwise, reports today his last cimzia injection was yesterday. States he had some further budesonide and has been taking 61mg 44mqd and the lialda. Has been taking the oMeprazole 40mg 34mrn. In further discussion about his past medications: Humira gave him an alligator type rash that required extensive treatment. States he was on imuran and prednisone at some point prior to the cimzia, and that he tolerated the imuran well without any problems at all. He says he has never been on 61mp.  34ms and CT as below.   Past Medical History:  Diagnosis Date  . Allergic state   . Anemia    IRON DEFICIENCY ANEMIA  . Crohn's disease (HCC)   [place]   . GERD (gastroesophageal reflux disease)   . Hemorrhoids   . Psoriasis   LTBI treated 2016  Past Surgical History:  Procedure Laterality Date  . APPENDECTOMY     May 2014  . COLONOSCOPY    . COLONOSCOPY WITH PROPOFOL N/A 02/05/2017   Procedure: COLONOSCOPY WITH PROPOFOL;  Surgeon: Martin Lollie [facility]ails[facility];  Service: Endoscopy;  Laterality: N/A;  . ESOPHAGOGASTRODUODENOSCOPY    . ESOPHAGOGASTRODUODENOSCOPY N/A 02/05/2017   Procedure: ESOPHAGOGASTRODUODENOSCOPY (EGD);  Surgeon: Martin Lollie [facility]ails[facility];  Service: Endoscopy;  Laterality: N/A;    History reviewed. No pertinent family history.   Social History  Substance Use Topics  . Smoking status: Never Smoker  . Smokeless tobacco: Never Used  . Alcohol use No    Prior to Admission medications   Medication Sig Start Date End Date Taking? Authorizing Provider  budesonide (ENTOCORT EC) 3  MG 24 hr capsule Take 3 mg by mouth at bedtime.   Yes Historical Provider, MD  Certolizumab Pegol 2 X 200 MG/ML KIT Inject into the skin every 14 (fourteen) days.    Yes Historical Provider, MD  mesalamine (LIALDA) 1.2 G EC tablet Take 1.2 g by mouth daily.    Yes Historical Provider, MD  omeprazole (PRILOSEC) 40 MG capsule Take 40 mg by mouth daily as needed.    Yes Historical Provider, MD    Current Facility-Administered Medications  Medication Dose Route Frequency Provider Last Rate Last Dose  . acetaminophen (TYLENOL) tablet 650 mg  650 mg Oral Q6H PRN Daniel Loll, MD       Or  . acetaminophen (TYLENOL) suppository 650 mg  650 mg Rectal Q6H PRN Daniel Loll, MD      . albuterol (PROVENTIL) (2.5 MG/3ML) 0.083% nebulizer solution 2.5 mg  2.5 mg Nebulization Q2H PRN Daniel Loll, MD      . budesonide (ENTOCORT EC) 24 hr capsule 3 mg  3 mg Oral QHS Daniel Loll, MD      . ciprofloxacin (CIPRO) IVPB 400 mg  400 mg Intravenous Once Eula Listen, MD 200 mL/hr at 04/05/17 1210 400 mg at 04/05/17 1210  . ciprofloxacin (CIPRO) IVPB 400 mg  400 mg Intravenous Q12H Daniel Loll, MD      . enoxaparin (LOVENOX) injection 40 mg  40 mg Subcutaneous Q24H Daniel Loll, MD      . HYDROcodone-acetaminophen (NORCO/VICODIN) 5-325 MG per tablet 1-2 tablet  1-2 tablet Oral Q4H PRN Daniel Loll, MD      . ketorolac (TORADOL) 30 MG/ML injection 30 mg  30 mg Intravenous Q6H PRN Daniel Loll, MD      . mesalamine (LIALDA) EC tablet 1.2 g  1.2 g Oral Daily Daniel Loll, MD      . metroNIDAZOLE (FLAGYL) IVPB 500 mg  500 mg Intravenous Q8H Daniel Loll, MD      . ondansetron [facility]) tablet 4 mg  4 mg Oral Q6H PRN Daniel Loll, MD   4 mg at 04/05/17 1240   Or  . ondansetron (ZOFRAN) injection 4 mg  4 mg Intravenous Q6H PRN Daniel Loll, MD      . pantoprazole (PROTONIX) EC tablet 40 mg  40 mg Oral Daily Daniel Loll, MD   40 mg at 04/05/17 1240    Allergies as of 04/05/2017 - Review Complete 04/05/2017  Allergen Reaction Noted  .  Sulfamethoxazole-trimethoprim Anaphylaxis 05/18/2015  . Rifampin Other (See Comments) 08/17/2015     Review of Systems:    All systems reviewed and negative except where noted in HPI.      Physical Exam:  Vital signs in last 24 hours: Temp:  [98.3 F (36.8 C)-98.9 F (37.2 C)] 98.3 F (36.8 C) (04/12 1226) Pulse Rate:  [85-99] 98 (04/12 1226) Resp:  [16] 16 (04/12 1226) BP: (104-130)/(63-91) 112/70 (04/12 1226) SpO2:  [96 %-100 %] 98 % (04/12 1226) Weight:  [52.6 kg (116 lb)-53.1 kg (117 lb)] 53.1 kg (117 lb) (04/12 1220)   General:   Pleasant man in NAD Head:  Normocephalic and atraumatic. Eyes:   No icterus.  Conjunctiva pink. Ears:  Normal auditory acuity. Mouth: Mucosa pink moist, no lesions. Neck:  Supple; no masses felt Lungs:  Respirations even and unlabored. Lungs clear to auscultation bilaterally.   No wheezes, crackles, or rhonchi.  Heart:  S1S2, RRR, no MRG. No edema. Abdomen:   Flat, soft, minimal distension, minimal ruq tenderness otherwise nontender. Normal bowel sounds. No appreciable masses or hepatomegaly. No rebound signs or other peritoneal signs. Rectal- perianal skin thickened but no redness, drainage, inflammation, or sign of fistula Msk:  MAEW x4, No clubbing or cyanosis. Strength 5/5. Symmetrical without gross deformities. Neurologic:  Alert and  oriented x4;  Cranial nerves II-XII intact.  Skin:  Warm, dry, pink without significant lesions or rashes. Psych:  Alert and cooperative. Normal affect.  LAB RESULTS:  Recent Labs  04/05/17 0056  WBC 18.6*  HGB 16.6  HCT 52.6*  PLT 257   BMET  Recent Labs  04/05/17 0056  NA 135  K 4.0  CL 103  CO2 24  GLUCOSE 98  BUN 11  CREATININE 1.14  CALCIUM 9.2   LFT  Recent Labs  04/05/17 0056  PROT 8.3*  ALBUMIN 4.0  AST 16  ALT 12*  ALKPHOS 83  BILITOT 0.7   PT/INR No results for input(s): LABPROT, INR in the last 72 hours.  STUDIES: Ct Abdomen Pelvis W Contrast  Result Date:  04/05/2017 CLINICAL DATA:  Crohn disease. Generalized abdominal pain, vomiting and diarrhea. EXAM: CT ABDOMEN AND PELVIS WITH CONTRAST TECHNIQUE: Multidetector CT imaging of the abdomen and pelvis was performed using the standard protocol following bolus administration of intravenous contrast. CONTRAST:  118m ISOVUE-300 IOPAMIDOL (ISOVUE-300) INJECTION 61% COMPARISON:  None. FINDINGS: Lower chest: No significant pulmonary nodules or acute consolidative airspace disease. Hepatobiliary: Normal liver with no liver mass. Normal gallbladder with no radiopaque cholelithiasis. Minimal central intrahepatic biliary ductal dilatation. Normal caliber common bile duct, 5 mm diameter. No radiopaque choledocholithiasis. Pancreas: Normal, with no mass or duct dilation. Spleen: Normal size. No mass. Adrenals/Urinary Tract: Normal adrenals. Normal kidneys with no hydronephrosis and no renal mass. Normal bladder. Stomach/Bowel: Grossly normal stomach. There is a surgical anastomosis in the right abdomen, suspected to represent a neo ileocolic anastomosis status post ileocecal resection. There is an abnormal small bowel segment in the anterior pelvis measuring approximately 15 cm in length, demonstrating circumferential small bowel wall thickening and hyperenhancement (series 2/ image 61), proximal to which small bowel loops are mildly dilated up to 3.9 cm diameter with fluid levels. There is also abnormal small bowel wall thickening and hyperenhancement in the neo terminal ileum (series 5/ image 34). There is mild wall thickening with mucosal hyperenhancement in the mid sigmoid colon. Vascular/Lymphatic: Normal caliber abdominal aorta. Patent portal, splenic, hepatic and renal veins. No pathologically enlarged lymph nodes in the abdomen or pelvis. Reproductive: Normal size prostate. Other: No pneumoperitoneum. Trace pelvic and right paracolic gutter ascites. No focal fluid collection or fistula detected. Musculoskeletal: No  aggressive appearing focal osseous lesions. No evidence of sacroiliitis. IMPRESSION: 1. Surgical anastomosis in the right abdomen, suspect neo-ileocolic anastomosis status post ileocecal resection. 2. Suspected active Crohn ileitis within an abnormal long small bowel segment in the anterior pelvis, with associated functional mild small bowel obstruction. 3. 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 Active Crohn colitis in the mid sigmoid colon. 4. No fistula, abscess or free air detected.  Trace ascites. 5. Minimal central intrahepatic biliary ductal dilatation. Normal caliber common bile duct. Recommend correlation with serum bilirubin levels. Electronically Signed  By: Ilona Sorrel M.D.   On: 04/05/2017 09:56       Impression / Plan:   1. Crohns flare- agree with cipro/flagyl. Solumedrol 19m IV qid. Stool studies for cdiff/pcr. Cimzia levels, esr/crp. KUB in am due to possible mild sbo. Pantoprazole 440miv qd. Nutritionist to discuss GF diet. Further recommendations to follow.  Thank you very much for this consult. These services were provided by ChStephens NovemberNP-C, in collaboration with MaLollie SailsMD, with whom I have discussed this patient in full.   ChStephens NovemberNP-C

## 2017-04-05 NOTE — ED Notes (Signed)
Patient transported to CT 

## 2017-04-05 NOTE — ED Triage Notes (Signed)
Pt ambulatory to triage with steady gait, no distress noted. Pt c/o lower abdominal pain, N/V/D x1 day. Pt has HX of Crohn's disease and seen by PCP for celiacs disease rule out, undetermined results per pt.

## 2017-04-05 NOTE — H&P (Signed)
[facility] [place] NAME: Daniel Bird    MR#:  [id number]  DATE OF BIRTH:  [birth year]  DATE OF ADMISSION:  04/05/2017  PRIMARY CARE PHYSICIAN: Maryland Pink, MD   REQUESTING/REFERRING PHYSICIAN: Eula Listen, MD  CHIEF COMPLAINT:   Chief Complaint  Patient presents with  . Abdominal Pain  . Emesis  . Diarrhea   Abdominal pain, nausea and vomiting and diarrhea. HISTORY OF PRESENT ILLNESS:  Daniel Bird  is a 26 y.o. male with a known history of Crohn's disease, GERD and anemia. The patient presents to the ED with above chief complaints. He has a history of Crohn's disease which was diagnosed 10 years ago. He has had abdominal pain, which is diffuse, aching and sharp without radiation. He also complains of nausea, vomiting and episodes of watery nonbloody stools. He had a recent endoscopy and colonoscopy and reports that they told him he had some inflammation around his stomach but he has not been particular asymptomatic until yesterday. CT of the abdomen showed ileitis and colitis. Dr. Gustavo Lah suggests that admit the patient to start treatment.  PAST MEDICAL HISTORY:   Past Medical History:  Diagnosis Date  . Allergic state   . Anemia    IRON DEFICIENCY ANEMIA  . Crohn's disease ([place])   . Eczema   . GERD (gastroesophageal reflux disease)   . Hemorrhoids   . Psoriasis     PAST SURGICAL HISTORY:   Past Surgical History:  Procedure Laterality Date  . APPENDECTOMY     May 2014  . COLONOSCOPY    . COLONOSCOPY WITH PROPOFOL N/A 02/05/2017   Procedure: COLONOSCOPY WITH PROPOFOL;  Surgeon: Lollie Sails, MD;  Location: [facility] ENDOSCOPY;  Service: Endoscopy;  Laterality: N/A;  . ESOPHAGOGASTRODUODENOSCOPY    . ESOPHAGOGASTRODUODENOSCOPY N/A 02/05/2017   Procedure: ESOPHAGOGASTRODUODENOSCOPY (EGD);  Surgeon: Lollie Sails, MD;  Location: [facility] ENDOSCOPY;  Service: Endoscopy;  Laterality: N/A;    SOCIAL HISTORY:     Social History  Substance Use Topics  . Smoking status: Never Smoker  . Smokeless tobacco: Never Used  . Alcohol use No    FAMILY HISTORY:  History reviewed. No pertinent family history.  DRUG ALLERGIES:   Allergies  Allergen Reactions  . Sulfamethoxazole-Trimethoprim Anaphylaxis  . Rifampin Other (See Comments)    Flu like illness    REVIEW OF SYSTEMS:   Review of Systems  Constitutional: Positive for malaise/fatigue. Negative for chills and fever.  HENT: Negative for congestion.   Eyes: Negative for blurred vision and double vision.  Respiratory: Negative for cough, shortness of breath, wheezing and stridor.   Cardiovascular: Negative for chest pain and leg swelling.  Gastrointestinal: Positive for abdominal pain, diarrhea, heartburn, melena, nausea and vomiting. Negative for blood in stool and constipation.  Genitourinary: Negative for dysuria and hematuria.  Musculoskeletal: Negative for back pain.  Skin: Negative for itching and rash.  Neurological: Negative for dizziness, focal weakness, loss of consciousness and weakness.  Psychiatric/Behavioral: Negative for depression. The patient does not have insomnia.     MEDICATIONS AT HOME:   Prior to Admission medications   Medication Sig Start Date End Date Taking? Authorizing Provider  budesonide (ENTOCORT EC) 3 MG 24 hr capsule Take 3 mg by mouth at bedtime.   Yes Historical Provider, MD  Certolizumab Pegol 2 X 200 MG/ML KIT Inject into the skin every 14 (fourteen) days.    Yes Historical Provider, MD  mesalamine (LIALDA) 1.2 G EC tablet Take  1.2 g by mouth daily.    Yes Historical Provider, MD  omeprazole (PRILOSEC) 40 MG capsule Take 40 mg by mouth daily as needed.    Yes Historical Provider, MD      VITAL SIGNS:  Blood pressure 112/70, pulse 98, temperature 98.3 F (36.8 C), resp. rate 16, height 5' 6"  (1.676 m), weight 117 lb (53.1 kg), SpO2 98 %.  PHYSICAL EXAMINATION:  Physical Exam  GENERAL:  26  y.o.-year-old patient lying in the bed with no acute distress.  EYES: Pupils equal, round, reactive to light and accommodation. No scleral icterus. Extraocular muscles intact.  HEENT: Head atraumatic, normocephalic. Oropharynx and nasopharynx clear. No teeth. NECK:  Supple, no jugular venous distention. No thyroid enlargement, no tenderness.  LUNGS: Normal breath sounds bilaterally, no wheezing, rales,rhonchi or crepitation. No use of accessory muscles of respiration.  CARDIOVASCULAR: S1, S2 normal. No murmurs, rubs, or gallops.  ABDOMEN: Soft, abdominal tenderness, nondistended. Bowel sounds present. No organomegaly or mass.  EXTREMITIES: No pedal edema, cyanosis, or clubbing.  NEUROLOGIC: Cranial nerves II through XII are intact. Muscle strength 5/5 in all extremities. Sensation intact. Gait not checked.  PSYCHIATRIC: The patient is alert and oriented x 3.  SKIN: No obvious rash, lesion, or ulcer.   LABORATORY PANEL:   CBC  Recent Labs Lab 04/05/17 0056  WBC 18.6*  HGB 16.6  HCT 52.6*  PLT 257   ------------------------------------------------------------------------------------------------------------------  Chemistries   Recent Labs Lab 04/05/17 0056  NA 135  K 4.0  CL 103  CO2 24  GLUCOSE 98  BUN 11  CREATININE 1.14  CALCIUM 9.2  AST 16  ALT 12*  ALKPHOS 83  BILITOT 0.7   ------------------------------------------------------------------------------------------------------------------  Cardiac Enzymes No results for input(s): TROPONINI in the last 168 hours. ------------------------------------------------------------------------------------------------------------------  RADIOLOGY:  Ct Abdomen Pelvis W Contrast  Result Date: 04/05/2017 CLINICAL DATA:  Crohn disease. Generalized abdominal pain, vomiting and diarrhea. EXAM: CT ABDOMEN AND PELVIS WITH CONTRAST TECHNIQUE: Multidetector CT imaging of the abdomen and pelvis was performed using the standard  protocol following bolus administration of intravenous contrast. CONTRAST:  161m ISOVUE-300 IOPAMIDOL (ISOVUE-300) INJECTION 61% COMPARISON:  None. FINDINGS: Lower chest: No significant pulmonary nodules or acute consolidative airspace disease. Hepatobiliary: Normal liver with no liver mass. Normal gallbladder with no radiopaque cholelithiasis. Minimal central intrahepatic biliary ductal dilatation. Normal caliber common bile duct, 5 mm diameter. No radiopaque choledocholithiasis. Pancreas: Normal, with no mass or duct dilation. Spleen: Normal size. No mass. Adrenals/Urinary Tract: Normal adrenals. Normal kidneys with no hydronephrosis and no renal mass. Normal bladder. Stomach/Bowel: Grossly normal stomach. There is a surgical anastomosis in the right abdomen, suspected to represent a neo ileocolic anastomosis status post ileocecal resection. There is an abnormal small bowel segment in the anterior pelvis measuring approximately 15 cm in length, demonstrating circumferential small bowel wall thickening and hyperenhancement (series 2/ image 61), proximal to which small bowel loops are mildly dilated up to 3.9 cm diameter with fluid levels. There is also abnormal small bowel wall thickening and hyperenhancement in the neo terminal ileum (series 5/ image 34). There is mild wall thickening with mucosal hyperenhancement in the mid sigmoid colon. Vascular/Lymphatic: Normal caliber abdominal aorta. Patent portal, splenic, hepatic and renal veins. No pathologically enlarged lymph nodes in the abdomen or pelvis. Reproductive: Normal size prostate. Other: No pneumoperitoneum. Trace pelvic and right paracolic gutter ascites. No focal fluid collection or fistula detected. Musculoskeletal: No aggressive appearing focal osseous lesions. No evidence of sacroiliitis. IMPRESSION: 1. Surgical anastomosis in the right  abdomen, suspect neo-ileocolic anastomosis status post ileocecal resection. 2. Suspected active Crohn ileitis within  an abnormal long small bowel segment in the anterior pelvis, with associated functional mild small bowel obstruction. 3. 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 Active Crohn colitis in the mid sigmoid colon. 4. No fistula, abscess or free air detected.  Trace ascites. 5. Minimal central intrahepatic biliary ductal dilatation. Normal caliber common bile duct. Recommend correlation with serum bilirubin levels. Electronically Signed   By: Ilona Sorrel M.D.   On: 04/05/2017 09:56      IMPRESSION AND PLAN:   Crohn's disease flare with colitis The patient will be admitted to medical floor. Continue Cipro and Flagyl. Pain control, Zofran when necessary. Follow-up with Dr. Gustavo Lah.  Leukocytosis due to above. Follow-up CBC.  GERD. Continue Protonix. All the records are reviewed and case discussed with ED provider. Management plans discussed with the patient, family and they are in agreement.  CODE STATUS: Full code  TOTAL TIME TAKING CARE OF THIS PATIENT: 52 minutes.    Demetrios Loll M.D on 04/05/2017 at 1:43 PM  Between 7am to 6pm - Pager - [phone]  After 6pm go to [email or web address] - Proofreader  [facility]  Office  [phone]  CC: Primary care physician; Maryland Pink, MD   Note: This dictation was prepared with Dragon dictation along with smaller phrase technology. Any transcriptional errors that result from this process are unintentional.

## 2017-04-05 NOTE — ED Notes (Addendum)
Note entered in error

## 2017-04-05 NOTE — ED Provider Notes (Signed)
[facility] Emergency Department Provider Note  ____________________________________________   First MD Initiated Contact with Patient 04/05/17 0559     (approximate)  I have reviewed the triage vital signs and the nursing notes.   HISTORY  Chief Complaint Abdominal Pain; Emesis; and Diarrhea    HPI Daniel Bird is a 26 y.o. male with a history of Crohn's disease diagnosed about 10 years ago who takes certolizumab (Cimzia) injections at home every 2 weeks and just had his injection yesterday who presentsfor evaluation of acute onset abdominal pain with multiple episodes of vomiting and diarrhea over the last 24 hours.  The abdominal pain is throughout his abdomen is described as aching and sharp.  He has not been able to tolerate any liquids or food today due to the vomiting.  He has had numerous episodes of watery, nonbloody stools.  His symptoms are severe and nothing in particular is making them better or worse.  He describes the symptoms as severe.  He denies fever/chills, chest pain, shortness of breath, dysuria.  His mom thinks that the last time he had a flare was about a year and a half ago.  He has had a partial bowel resection previously but was seen relatively recently by his local gastroenterology provider [facility] representative Jacqulyn Liner at [facility]).  They are apparently evaluating him for possible celiac disease but he does not know the results of that study.  He had a recent endoscopy and colonoscopy and reports that they told him he had some inflammation around his stomach but he has not been particular asymptomatic until yesterday.   Past Medical History:  Diagnosis Date  . Allergic state   . Anemia    IRON DEFICIENCY ANEMIA  . Crohn's disease ([place])   . Eczema   . GERD (gastroesophageal reflux disease)   . Hemorrhoids   . Psoriasis     Patient Active Problem List   Diagnosis Date Noted  . Acid reflux 08/17/2015  . Iron deficiency anemia  05/18/2015  . Crohn's disease ([place]) 05/18/2015    Past Surgical History:  Procedure Laterality Date  . APPENDECTOMY     May 2014  . COLONOSCOPY    . COLONOSCOPY WITH PROPOFOL N/A 02/05/2017   Procedure: COLONOSCOPY WITH PROPOFOL;  Surgeon: Lollie Sails, MD;  Location: [facility] ENDOSCOPY;  Service: Endoscopy;  Laterality: N/A;  . ESOPHAGOGASTRODUODENOSCOPY    . ESOPHAGOGASTRODUODENOSCOPY N/A 02/05/2017   Procedure: ESOPHAGOGASTRODUODENOSCOPY (EGD);  Surgeon: Lollie Sails, MD;  Location[facility] ENDOSCOPY;  Service: Endoscopy;  Laterality: N/A;    Prior to Admission medications   Medication Sig Start Date End Date Taking? Authorizing Provider  Certolizumab Pegol 2 X 200 MG/ML KIT Inject into the skin. 05/04/15 06/03/15  Historical Provider, MD  CIMZIA PREFILLED 2 X 200 MG/ML KIT  06/17/15   Historical Provider, MD  fluticasone (FLONASE) 50 MCG/ACT nasal spray Place 1 spray into both nostrils 2 (two) times daily.    Historical Provider, MD  iron dextran complex 1,000 mg in sodium chloride 0.9 % 500 mL Inject 1,000 mg into the vein every 3 (three) months.    Historical Provider, MD  mesalamine (LIALDA) 1.2 G EC tablet Take by mouth.    Historical Provider, MD  omeprazole (PRILOSEC) 40 MG capsule Take by mouth. Reported on 05/02/2016    Historical Provider, MD  prednisoLONE acetate (PRED FORTE) 1 % ophthalmic suspension Apply to eye. Reported on 05/02/2016    Historical Provider, MD    Allergies Sulfamethoxazole-trimethoprim and Rifampin  History reviewed. No pertinent family history.  Social History Social History  Substance Use Topics  . Smoking status: Never Smoker  . Smokeless tobacco: Never Used  . Alcohol use No    Review of Systems Constitutional: No fever/chills Eyes: No visual changes. ENT: No sore throat. Cardiovascular: Denies chest pain. Respiratory: Denies shortness of breath. Gastrointestinal: Diffuse abdominal pain.  Persistent vomiting and diarrhea x 1  day. Genitourinary: Negative for dysuria. Musculoskeletal: Negative for back pain. Skin: Negative for rash. Neurological: Negative for headaches, focal weakness or numbness.  10-point ROS otherwise negative.  ____________________________________________   PHYSICAL EXAM:  VITAL SIGNS: ED Triage Vitals  Enc Vitals Group     BP 04/05/17 0057 130/68     Pulse Rate 04/05/17 0057 97     Resp 04/05/17 0057 16     Temp 04/05/17 0057 98.9 F (37.2 C)     Temp Source 04/05/17 0057 Oral     SpO2 04/05/17 0057 100 %     Weight 04/05/17 0057 116 lb (52.6 kg)     Height 04/05/17 0057 5' 6"  (1.676 m)     Head Circumference --      Peak Flow --      Pain Score 04/05/17 0612 4     Pain Loc --      Pain Edu? --      Excl. in [place]? --     Constitutional: Alert and oriented. NAD, but appears somewhat chronically ill Eyes: Conjunctivae are normal. PERRL. EOMI. Head: Atraumatic.  Mild temporal wasting. Nose: No congestion/rhinnorhea. Mouth/Throat: Mucous membranes are moist. Neck: No stridor.  No meningeal signs.   Cardiovascular: Normal rate, regular rhythm. Good peripheral circulation. Grossly normal heart sounds. Respiratory: Normal respiratory effort.  No retractions. Lungs CTAB. Gastrointestinal: Thin habitus.  Soft with diffuse tenderness throughout the abdomen Musculoskeletal: No lower extremity tenderness nor edema. No gross deformities of extremities. Neurologic:  Normal speech and language. No gross focal neurologic deficits are appreciated.  Skin:  Skin is warm, dry and intact. No rash noted. Psychiatric: Mood and affect are normal. Speech and behavior are normal.  ____________________________________________   LABS (all labs ordered are listed, but only abnormal results are displayed)  Labs Reviewed  COMPREHENSIVE METABOLIC PANEL - Abnormal; Notable for the following:       Result Value   Total Protein 8.3 (*)    ALT 12 (*)    All other components within normal limits   CBC - Abnormal; Notable for the following:    WBC 18.6 (*)    RBC 6.68 (*)    HCT 52.6 (*)    MCV 78.8 (*)    MCH 24.8 (*)    MCHC 31.5 (*)    RDW 15.4 (*)    All other components within normal limits  URINALYSIS, COMPLETE (UACMP) WITH MICROSCOPIC - Abnormal; Notable for the following:    Color, Urine AMBER (*)    APPearance CLEAR (*)    Specific Gravity, Urine 1.031 (*)    Ketones, ur 20 (*)    Protein, ur 30 (*)    Squamous Epithelial / LPF 0-5 (*)    All other components within normal limits  LIPASE, BLOOD   ____________________________________________  EKG  None - EKG not ordered by ED physician ____________________________________________  RADIOLOGY   No results found.  ____________________________________________   PROCEDURES  Critical Care performed: No   Procedure(s) performed:   Procedures   ____________________________________________   INITIAL IMPRESSION / ASSESSMENT AND PLAN / ED COURSE  Pertinent labs & imaging results that were available during my care of the patient were reviewed by me and considered in my medical decision making (see chart for details).  The patient has a moderate leukocytosis of about 18.  He is immunosuppressed on Cimzia and has been for years.  He has diffuse abdominal tenderness throughout with persistent vomiting and diarrhea.  He is likely having a Crohn's flare but given the abdominal pain and a history of resection I will evaluate with the CT scan.  There is no role for prednisone right now given that he is maintained without it.  He will need reassessment after the CT scan to determine if he requires admission versus transfer because there is no gastroenterology coverage today although it may be possible to reach Ms. Jacqulyn Liner by phone in clinic since this is a weekday.  Transferring ED care to Dr. Mariea Clonts at 7:30am to follow up CT results and reassess the patient to determine appropriate disposition.      ____________________________________________  FINAL CLINICAL IMPRESSION(S) / ED DIAGNOSES  Final diagnoses:  Generalized abdominal pain  Nausea vomiting and diarrhea  Crohn's disease with complication, unspecified gastrointestinal tract location [facility] [place])     MEDICATIONS GIVEN DURING THIS VISIT:  Medications  iopamidol (ISOVUE-300) 61 % injection 15 mL (15 mLs Oral Contrast Given 04/05/17 0623)  sodium chloride 0.9 % bolus 1,000 mL (0 mLs Intravenous Stopped 04/05/17 0723)  morphine 4 MG/ML injection 4 mg (4 mg Intravenous Given 04/05/17 0623)  ondansetron (ZOFRAN) injection 4 mg (4 mg Intravenous Given 04/05/17 0624)     NEW OUTPATIENT MEDICATIONS STARTED DURING THIS VISIT:  New Prescriptions   No medications on file    Modified Medications   No medications on file    Discontinued Medications   No medications on file     Note:  This document was prepared using Dragon voice recognition software and may include unintentional dictation errors.    Hinda Kehr, MD 04/05/17 [phone]

## 2017-04-06 ENCOUNTER — Inpatient Hospital Stay: Payer: BLUE CROSS/BLUE SHIELD

## 2017-04-06 LAB — CBC
HCT: 45.9 % (ref 40.0–52.0)
Hemoglobin: 14.6 g/dL (ref 13.0–18.0)
MCH: 25.3 pg — ABNORMAL LOW (ref 26.0–34.0)
MCHC: 31.9 g/dL — ABNORMAL LOW (ref 32.0–36.0)
MCV: 79.4 fL — AB (ref 80.0–100.0)
Platelets: 214 10*3/uL (ref 150–440)
RBC: 5.78 MIL/uL (ref 4.40–5.90)
RDW: 15.2 % — AB (ref 11.5–14.5)
WBC: 11.8 10*3/uL — AB (ref 3.8–10.6)

## 2017-04-06 LAB — BASIC METABOLIC PANEL
Anion gap: 6 (ref 5–15)
BUN: 9 mg/dL (ref 6–20)
CO2: 25 mmol/L (ref 22–32)
Calcium: 8.8 mg/dL — ABNORMAL LOW (ref 8.9–10.3)
Chloride: 103 mmol/L (ref 101–111)
Creatinine, Ser: 0.98 mg/dL (ref 0.61–1.24)
GFR calc Af Amer: >60 mL/min (ref >60)
Glucose, Bld: 134 mg/dL — ABNORMAL HIGH (ref 65–99)
Potassium: 4.6 mmol/L (ref 3.5–5.1)
SODIUM: 134 mmol/L — AB (ref 135–145)

## 2017-04-06 LAB — CMV ANTIBODY, IGG (EIA)

## 2017-04-06 LAB — HIV ANTIBODY (ROUTINE TESTING W REFLEX): HIV Screen 4th Generation wRfx: NONREACTIVE

## 2017-04-06 MED ORDER — PANTOPRAZOLE SODIUM 40 MG PO TBEC
40.0000 mg | DELAYED_RELEASE_TABLET | Freq: Every day | ORAL | Status: DC
  Administered 2017-04-06: 40 mg via ORAL
  Filled 2017-04-06: qty 1

## 2017-04-06 NOTE — Progress Notes (Signed)
[place] at [facility]                                                                                                                                                                                  Patient Demographics   Daniel Bird, is a 26 y.o. male, DOB - 1990-12-29, IOE:[id number]  Admit date - 04/05/2017   Admitting Physician Demetrios Loll, MD  Outpatient Primary MD for the patient is [place] Pink, MD   LOS - 1  Subjective: Patient reports that his abdominal pain is improved. Still has diarrhea    Review of Systems:   CONSTITUTIONAL: No documented fever. No fatigue, weakness. No weight gain, no weight loss.  EYES: No blurry or double vision.  ENT: No tinnitus. No postnasal drip. No redness of the oropharynx.  RESPIRATORY: No cough, no wheeze, no hemoptysis. No dyspnea.  CARDIOVASCULAR: No chest pain. No orthopnea. No palpitations. No syncope.  GASTROINTESTINAL: No nausea, no vomiting or Positive diarrhea. Positive abdominal pain. No melena or hematochezia.  GENITOURINARY: No dysuria or hematuria.  ENDOCRINE: No polyuria or nocturia. No heat or cold intolerance.  HEMATOLOGY: No anemia. No bruising. No bleeding.  INTEGUMENTARY: No rashes. No lesions.  MUSCULOSKELETAL: No arthritis. No swelling. No gout.  NEUROLOGIC: No numbness, tingling, or ataxia. No seizure-type activity.  PSYCHIATRIC: No anxiety. No insomnia. No ADD.    Vitals:   Vitals:   04/05/17 1422 04/05/17 2037 04/06/17 0420 04/06/17 0812  BP: 109/61 (!) 103/57 (!) 102/57 (!) 98/56  Pulse: 91 91 71 81  Resp: 18 16 16 18   Temp: 98.3 F (36.8 C) 98.8 F (37.1 C) 97.9 F (36.6 C) 97.8 F (36.6 C)  TempSrc: Oral Oral Oral Oral  SpO2: 95% 99% 99% 100%  Weight:      Height:        Wt Readings from Last 3 Encounters:  04/05/17 117 lb (53.1 kg)  02/05/17 116 lb (52.6 kg)  10/31/16 117 lb 8.1 oz (53.3 kg)     Intake/Output Summary (Last 24 hours) at 04/06/17 1418 Last data  filed at 04/05/17 2200  Gross per 24 hour  Intake              400 ml  Output                0 ml  Net              400 ml    Physical Exam:   GENERAL: Pleasant-appearing in no apparent distress.  HEAD, EYES, EARS, NOSE AND THROAT: Atraumatic, normocephalic. Extraocular muscles are intact. Pupils equal and reactive to light. Sclerae anicteric. No conjunctival injection. No oro-pharyngeal  erythema.  NECK: Supple. There is no jugular venous distention. No bruits, no lymphadenopathy, no thyromegaly.  HEART: Regular rate and rhythm,. No murmurs, no rubs, no clicks.  LUNGS: Clear to auscultation bilaterally. No rales or rhonchi. No wheezes.  ABDOMEN: Soft, flat, nontender, nondistended. Has good bowel sounds. No hepatosplenomegaly appreciated.  EXTREMITIES: No evidence of any cyanosis, clubbing, or peripheral edema.  +2 pedal and radial pulses bilaterally.  NEUROLOGIC: The patient is alert, awake, and oriented x3 with no focal motor or sensory deficits appreciated bilaterally.  SKIN: Moist and warm with no rashes appreciated.  Psych: Not anxious, depressed LN: No inguinal LN enlargement    Antibiotics   Anti-infectives    Start     Dose/Rate Route Frequency Ordered Stop   04/05/17 2200  ciprofloxacin (CIPRO) IVPB 400 mg     400 mg 200 mL/hr over 60 Minutes Intravenous Every 12 hours 04/05/17 1206     04/05/17 1300  metroNIDAZOLE (FLAGYL) IVPB 500 mg     500 mg 100 mL/hr over 60 Minutes Intravenous Every 8 hours 04/05/17 1206     04/05/17 1045  ciprofloxacin (CIPRO) IVPB 400 mg     400 mg 200 mL/hr over 60 Minutes Intravenous  Once 04/05/17 1035 04/05/17 1900   04/05/17 1045  metroNIDAZOLE (FLAGYL) IVPB 500 mg  Status:  Discontinued     500 mg 100 mL/hr over 60 Minutes Intravenous  Once 04/05/17 1035 04/05/17 1222      Medications   Scheduled Meds: . ciprofloxacin  400 mg Intravenous Q12H  . enoxaparin (LOVENOX) injection  40 mg Subcutaneous Q24H  . mesalamine  1.2 g Oral  Daily  . methylPREDNISolone (SOLU-MEDROL) injection  15.2 mg Intravenous Q6H  . metronidazole  500 mg Intravenous Q8H  . multivitamin with minerals  1 tablet Oral Daily  . pantoprazole  40 mg Oral QAC supper   Continuous Infusions: PRN Meds:.acetaminophen **OR** acetaminophen, albuterol, HYDROcodone-acetaminophen, ketorolac, ondansetron **OR** ondansetron (ZOFRAN) IV   Data Review:   Micro Results Recent Results (from the past 240 hour(s))  Gastrointestinal Panel by PCR , Stool     Status: None   Collection Time: 04/05/17  5:01 PM  Result Value Ref Range Status   Campylobacter species NOT DETECTED NOT DETECTED Final   Plesimonas shigelloides NOT DETECTED NOT DETECTED Final   Salmonella species NOT DETECTED NOT DETECTED Final   Yersinia enterocolitica NOT DETECTED NOT DETECTED Final   Vibrio species NOT DETECTED NOT DETECTED Final   Vibrio cholerae NOT DETECTED NOT DETECTED Final   Enteroaggregative E coli (EAEC) NOT DETECTED NOT DETECTED Final   Enteropathogenic E coli (EPEC) NOT DETECTED NOT DETECTED Final   Enterotoxigenic E coli (ETEC) NOT DETECTED NOT DETECTED Final   Shiga like toxin producing E coli (STEC) NOT DETECTED NOT DETECTED Final   Shigella/Enteroinvasive E coli (EIEC) NOT DETECTED NOT DETECTED Final   Cryptosporidium NOT DETECTED NOT DETECTED Final   Cyclospora cayetanensis NOT DETECTED NOT DETECTED Final   Entamoeba histolytica NOT DETECTED NOT DETECTED Final   Giardia lamblia NOT DETECTED NOT DETECTED Final   Adenovirus F40/41 NOT DETECTED NOT DETECTED Final   Astrovirus NOT DETECTED NOT DETECTED Final   Norovirus GI/GII NOT DETECTED NOT DETECTED Final   Rotavirus A NOT DETECTED NOT DETECTED Final   Sapovirus (I, II, IV, and V) NOT DETECTED NOT DETECTED Final  C difficile quick scan w PCR reflex     Status: None   Collection Time: 04/05/17  5:01 PM  Result Value Ref Range  Status   C Diff antigen NEGATIVE NEGATIVE Final   C Diff toxin NEGATIVE NEGATIVE Final    C Diff interpretation No C. difficile detected.  Final    Radiology Reports Dg Abd 1 View  Result Date: 04/06/2017 CLINICAL DATA:  Abdominal pain for 4 days. Personal history crest disease. EXAM: ABDOMEN - 1 VIEW COMPARISON:  CT of the abdomen pelvis 04/05/2017. FINDINGS: Distention of distal small bowel is again seen. Small bowel is partially decompressed compared to yesterday's study. Contrast material is now seen throughout the colon which is decompressed. There is no free air. The lung bases are clear. Mild rightward curvature is present in the lumbar spine. IMPRESSION: 1. Partial decompression of small bowel. 2. Transit of oral contrast into the colon. Electronically Signed   By: San Morelle M.D.   On: 04/06/2017 09:54   Ct Abdomen Pelvis W Contrast  Result Date: 04/05/2017 CLINICAL DATA:  Crohn disease. Generalized abdominal pain, vomiting and diarrhea. EXAM: CT ABDOMEN AND PELVIS WITH CONTRAST TECHNIQUE: Multidetector CT imaging of the abdomen and pelvis was performed using the standard protocol following bolus administration of intravenous contrast. CONTRAST:  151m ISOVUE-300 IOPAMIDOL (ISOVUE-300) INJECTION 61% COMPARISON:  None. FINDINGS: Lower chest: No significant pulmonary nodules or acute consolidative airspace disease. Hepatobiliary: Normal liver with no liver mass. Normal gallbladder with no radiopaque cholelithiasis. Minimal central intrahepatic biliary ductal dilatation. Normal caliber common bile duct, 5 mm diameter. No radiopaque choledocholithiasis. Pancreas: Normal, with no mass or duct dilation. Spleen: Normal size. No mass. Adrenals/Urinary Tract: Normal adrenals. Normal kidneys with no hydronephrosis and no renal mass. Normal bladder. Stomach/Bowel: Grossly normal stomach. There is a surgical anastomosis in the right abdomen, suspected to represent a neo ileocolic anastomosis status post ileocecal resection. There is an abnormal small bowel segment in the anterior  pelvis measuring approximately 15 cm in length, demonstrating circumferential small bowel wall thickening and hyperenhancement (series 2/ image 61), proximal to which small bowel loops are mildly dilated up to 3.9 cm diameter with fluid levels. There is also abnormal small bowel wall thickening and hyperenhancement in the neo terminal ileum (series 5/ image 34). There is mild wall thickening with mucosal hyperenhancement in the mid sigmoid colon. Vascular/Lymphatic: Normal caliber abdominal aorta. Patent portal, splenic, hepatic and renal veins. No pathologically enlarged lymph nodes in the abdomen or pelvis. Reproductive: Normal size prostate. Other: No pneumoperitoneum. Trace pelvic and right paracolic gutter ascites. No focal fluid collection or fistula detected. Musculoskeletal: No aggressive appearing focal osseous lesions. No evidence of sacroiliitis. IMPRESSION: 1. Surgical anastomosis in the right abdomen, suspect neo-ileocolic anastomosis status post ileocecal resection. 2. Suspected active Crohn ileitis within an abnormal long small bowel segment in the anterior pelvis, with associated functional mild small bowel obstruction. 3. 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 Active Crohn colitis in the mid sigmoid colon. 4. No fistula, abscess or free air detected.  Trace ascites. 5. Minimal central intrahepatic biliary ductal dilatation. Normal caliber common bile duct. Recommend correlation with serum bilirubin levels. Electronically Signed   By: JIlona SorrelM.D.   On: 04/05/2017 09:56     CBC  Recent Labs Lab 04/05/17 0056 04/06/17 0431  WBC 18.6* 11.8*  HGB 16.6 14.6  HCT 52.6* 45.9  PLT 257 214  MCV 78.8* 79.4*  MCH 24.8* 25.3*  MCHC 31.5* 31.9*  RDW 15.4* 15.2*    Chemistries   Recent Labs Lab 04/05/17 0056 04/06/17 0431  NA 135 134*  K 4.0 4.6  CL 103 103  CO2 24 25  GLUCOSE 98 134*  BUN 11 9  CREATININE 1.14 0.98  CALCIUM 9.2 8.8*  AST 16  --   ALT 12*  --    ALKPHOS 83  --   BILITOT 0.7  --    ------------------------------------------------------------------------------------------------------------------ estimated creatinine clearance is 84.3 mL/min (by C-G formula based on SCr of 0.98 mg/dL). ------------------------------------------------------------------------------------------------------------------ No results for input(s): HGBA1C in the last 72 hours. ------------------------------------------------------------------------------------------------------------------ No results for input(s): CHOL, HDL, LDLCALC, TRIG, CHOLHDL, LDLDIRECT in the last 72 hours. ------------------------------------------------------------------------------------------------------------------ No results for input(s): TSH, T4TOTAL, T3FREE, THYROIDAB in the last 72 hours.  Invalid input(s): FREET3 ------------------------------------------------------------------------------------------------------------------ No results for input(s): VITAMINB12, FOLATE, FERRITIN, TIBC, IRON, RETICCTPCT in the last 72 hours.  Coagulation profile No results for input(s): INR, PROTIME in the last 168 hours.  No results for input(s): DDIMER in the last 72 hours.  Cardiac Enzymes No results for input(s): CKMB, TROPONINI, MYOGLOBIN in the last 168 hours.  Invalid input(s): CK ------------------------------------------------------------------------------------------------------------------ Invalid input(s): [place]   Patient is a 26 year old admitted with abdominal pain and diarrhea  1. Acute Crohn's flare Continued therapy with Cipro and Flagyl IV steroids  2. GERD continue Protonix      Code Status Orders        Start     Ordered   04/05/17 1207  Full code  Continuous     04/05/17 1206    Code Status History    Date Active Date Inactive Code Status Order ID Comments User Context   This patient has a current code status but no  historical code status.           Consults  58mn  DVT Prophylaxis  Lovenox   Lab Results  Component Value Date   PLT 214 04/06/2017     Time Spent in minutes    Greater than 50% of time spent in care coordination and counseling patient regarding the condition and plan of care.   PDustin FlockM.D on 04/06/2017 at 2:18 PM  Between 7am to 6pm - Pager - [phone]  After 6pm go to [email or web address] - password [email or web address]   Office  [phone]

## 2017-04-06 NOTE — Consult Note (Signed)
Subjective: Patient seen for crohns exacerbation.  Patient states abd pain is 3/10, improving.  Starting to pass some gas.  No N/V.   Objective: Vital signs in last 24 hours: Temp:  [97.8 F (36.6 C)-98.8 F (37.1 C)] 97.8 F (36.6 C) (04/13 0812) Pulse Rate:  [71-98] 81 (04/13 0812) Resp:  [16-18] 18 (04/13 0812) BP: (98-112)/(56-70) 98/56 (04/13 0812) SpO2:  [95 %-100 %] 100 % (04/13 0812) Weight:  [53.1 kg (117 lb)] 53.1 kg (117 lb) (04/12 1220) Blood pressure (!) 98/56, pulse 81, temperature 97.8 F (36.6 C), temperature source Oral, resp. rate 18, height 5' 6"  (1.676 m), weight 53.1 kg (117 lb), SpO2 100 %.   Intake/Output from previous day: 04/12 0701 - 04/13 0700 In: 1500 [P.O.:100; IV Piggyback:1400] Out: -   Intake/Output this shift: No intake/output data recorded.   General appearance:  27 year old male no acute distress Resp:  Bilaterally clear to auscultation Cardio:  Regular rate and rhythm without rub or gallop GI:  Soft mild tenderness to palpation left lower quadrant and across the lower abdomen. There are no masses or rebound. Bowel sounds are positive normoactive. There are no high-pitched sounds. Extremities:  No clubbing cyanosis or edema   Lab Results: Results for orders placed or performed during the hospital encounter of 04/05/17 (from the past 24 hour(s))  HIV antibody (Routine Testing)     Status: None   Collection Time: 04/05/17 12:30 PM  Result Value Ref Range   HIV Screen 4th Generation wRfx Non Reactive Non Reactive  Sedimentation rate     Status: None   Collection Time: 04/05/17  2:23 PM  Result Value Ref Range   Sed Rate 1 0 - 15 mm/hr  C-reactive protein     Status: Abnormal   Collection Time: 04/05/17  2:23 PM  Result Value Ref Range   CRP 4.7 (H) <1.0 mg/dL  Gastrointestinal Panel by PCR , Stool     Status: None   Collection Time: 04/05/17  5:01 PM  Result Value Ref Range   Campylobacter species NOT DETECTED NOT DETECTED    Plesimonas shigelloides NOT DETECTED NOT DETECTED   Salmonella species NOT DETECTED NOT DETECTED   Yersinia enterocolitica NOT DETECTED NOT DETECTED   Vibrio species NOT DETECTED NOT DETECTED   Vibrio cholerae NOT DETECTED NOT DETECTED   Enteroaggregative E coli (EAEC) NOT DETECTED NOT DETECTED   Enteropathogenic E coli (EPEC) NOT DETECTED NOT DETECTED   Enterotoxigenic E coli (ETEC) NOT DETECTED NOT DETECTED   Shiga like toxin producing E coli (STEC) NOT DETECTED NOT DETECTED   Shigella/Enteroinvasive E coli (EIEC) NOT DETECTED NOT DETECTED   Cryptosporidium NOT DETECTED NOT DETECTED   Cyclospora cayetanensis NOT DETECTED NOT DETECTED   Entamoeba histolytica NOT DETECTED NOT DETECTED   Giardia lamblia NOT DETECTED NOT DETECTED   Adenovirus F40/41 NOT DETECTED NOT DETECTED   Astrovirus NOT DETECTED NOT DETECTED   Norovirus GI/GII NOT DETECTED NOT DETECTED   Rotavirus A NOT DETECTED NOT DETECTED   Sapovirus (I, II, IV, and V) NOT DETECTED NOT DETECTED  C difficile quick scan w PCR reflex     Status: None   Collection Time: 04/05/17  5:01 PM  Result Value Ref Range   C Diff antigen NEGATIVE NEGATIVE   C Diff toxin NEGATIVE NEGATIVE   C Diff interpretation No C. difficile detected.   Cmv antibody, IgG (EIA)     Status: None   Collection Time: 04/05/17  6:17 PM  Result Value Ref Range  CMV Ab - IgG <0.60 0.00 - 0.59 U/mL  Basic metabolic panel     Status: Abnormal   Collection Time: 04/06/17  4:31 AM  Result Value Ref Range   Sodium 134 (L) 135 - 145 mmol/L   Potassium 4.6 3.5 - 5.1 mmol/L   Chloride 103 101 - 111 mmol/L   CO2 25 22 - 32 mmol/L   Glucose, Bld 134 (H) 65 - 99 mg/dL   BUN 9 6 - 20 mg/dL   Creatinine, Ser 0.98 0.61 - 1.24 mg/dL   Calcium 8.8 (L) 8.9 - 10.3 mg/dL   GFR calc non Af Amer >60 >60 mL/min   GFR calc Af Amer >60 >60 mL/min   Anion gap 6 5 - 15  CBC     Status: Abnormal   Collection Time: 04/06/17  4:31 AM  Result Value Ref Range   WBC 11.8 (H) 3.8 -  10.6 K/uL   RBC 5.78 4.40 - 5.90 MIL/uL   Hemoglobin 14.6 13.0 - 18.0 g/dL   HCT 45.9 40.0 - 52.0 %   MCV 79.4 (L) 80.0 - 100.0 fL   MCH 25.3 (L) 26.0 - 34.0 pg   MCHC 31.9 (L) 32.0 - 36.0 g/dL   RDW 15.2 (H) 11.5 - 14.5 %   Platelets 214 150 - 440 K/uL      Recent Labs  04/05/17 0056 04/06/17 0431  WBC 18.6* 11.8*  HGB 16.6 14.6  HCT 52.6* 45.9  PLT 257 214   BMET  Recent Labs  04/05/17 0056 04/06/17 0431  NA 135 134*  K 4.0 4.6  CL 103 103  CO2 24 25  GLUCOSE 98 134*  BUN 11 9  CREATININE 1.14 0.98  CALCIUM 9.2 8.8*   LFT  Recent Labs  04/05/17 0056  PROT 8.3*  ALBUMIN 4.0  AST 16  ALT 12*  ALKPHOS 83  BILITOT 0.7   PT/INR No results for input(s): LABPROT, INR in the last 72 hours. Hepatitis Panel No results for input(s): HEPBSAG, HCVAB, HEPAIGM, HEPBIGM in the last 72 hours. C-Diff  Recent Labs  04/05/17 1701  CDIFFTOX NEGATIVE   No results for input(s): CDIFFPCR in the last 72 hours.   Studies/Results: Dg Abd 1 View  Result Date: 04/06/2017 CLINICAL DATA:  Abdominal pain for 4 days. Personal history crest disease. EXAM: ABDOMEN - 1 VIEW COMPARISON:  CT of the abdomen pelvis 04/05/2017. FINDINGS: Distention of distal small bowel is again seen. Small bowel is partially decompressed compared to yesterday's study. Contrast material is now seen throughout the colon which is decompressed. There is no free air. The lung bases are clear. Mild rightward curvature is present in the lumbar spine. IMPRESSION: 1. Partial decompression of small bowel. 2. Transit of oral contrast into the colon. Electronically Signed   By: San Morelle M.D.   On: 04/06/2017 09:54   Ct Abdomen Pelvis W Contrast  Result Date: 04/05/2017 CLINICAL DATA:  Crohn disease. Generalized abdominal pain, vomiting and diarrhea. EXAM: CT ABDOMEN AND PELVIS WITH CONTRAST TECHNIQUE: Multidetector CT imaging of the abdomen and pelvis was performed using the standard protocol following  bolus administration of intravenous contrast. CONTRAST:  173m ISOVUE-300 IOPAMIDOL (ISOVUE-300) INJECTION 61% COMPARISON:  None. FINDINGS: Lower chest: No significant pulmonary nodules or acute consolidative airspace disease. Hepatobiliary: Normal liver with no liver mass. Normal gallbladder with no radiopaque cholelithiasis. Minimal central intrahepatic biliary ductal dilatation. Normal caliber common bile duct, 5 mm diameter. No radiopaque choledocholithiasis. Pancreas: Normal, with no mass or duct  dilation. Spleen: Normal size. No mass. Adrenals/Urinary Tract: Normal adrenals. Normal kidneys with no hydronephrosis and no renal mass. Normal bladder. Stomach/Bowel: Grossly normal stomach. There is a surgical anastomosis in the right abdomen, suspected to represent a neo ileocolic anastomosis status post ileocecal resection. There is an abnormal small bowel segment in the anterior pelvis measuring approximately 15 cm in length, demonstrating circumferential small bowel wall thickening and hyperenhancement (series 2/ image 61), proximal to which small bowel loops are mildly dilated up to 3.9 cm diameter with fluid levels. There is also abnormal small bowel wall thickening and hyperenhancement in the neo terminal ileum (series 5/ image 34). There is mild wall thickening with mucosal hyperenhancement in the mid sigmoid colon. Vascular/Lymphatic: Normal caliber abdominal aorta. Patent portal, splenic, hepatic and renal veins. No pathologically enlarged lymph nodes in the abdomen or pelvis. Reproductive: Normal size prostate. Other: No pneumoperitoneum. Trace pelvic and right paracolic gutter ascites. No focal fluid collection or fistula detected. Musculoskeletal: No aggressive appearing focal osseous lesions. No evidence of sacroiliitis. IMPRESSION: 1. Surgical anastomosis in the right abdomen, suspect neo-ileocolic anastomosis status post ileocecal resection. 2. Suspected active Crohn ileitis within an abnormal long  small bowel segment in the anterior pelvis, with associated functional mild small bowel obstruction. 3. 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 Active Crohn colitis in the mid sigmoid colon. 4. No fistula, abscess or free air detected.  Trace ascites. 5. Minimal central intrahepatic biliary ductal dilatation. Normal caliber common bile duct. Recommend correlation with serum bilirubin levels. Electronically Signed   By: Ilona Sorrel M.D.   On: 04/05/2017 09:56    Scheduled Inpatient Medications:   . ciprofloxacin  400 mg Intravenous Q12H  . enoxaparin (LOVENOX) injection  40 mg Subcutaneous Q24H  . mesalamine  1.2 g Oral Daily  . methylPREDNISolone (SOLU-MEDROL) injection  15.2 mg Intravenous Q6H  . metronidazole  500 mg Intravenous Q8H  . multivitamin with minerals  1 tablet Oral Daily  . pantoprazole (PROTONIX) IV  40 mg Intravenous Q6H    Continuous Inpatient Infusions:    PRN Inpatient Medications:  acetaminophen **OR** acetaminophen, albuterol, HYDROcodone-acetaminophen, ketorolac, ondansetron **OR** ondansetron (ZOFRAN) IV  Miscellaneous:   Assessment:  1. Exacerbation of known Crohn's disease. Stool PCR studies have been negative. CMV negative. Currently on IV steroids as well as antibiotics. Improving.  Plan:  1. Continue current. He is starting to pass gas and apparently the narrow spot at the anastomosis is open his he is now got contrast in his colon as well. Would continue him on full liquids for now, perhaps to start low residue over the weekend. I'll ask GI on call to follow with him over the weekend. He is not due for his next dose of Cimzia for about another 10 days or so, this can be accomplished as outpatient.  Lollie Sails MD 04/06/2017, 12:19 PM

## 2017-04-07 LAB — BASIC METABOLIC PANEL
Anion gap: 4 — ABNORMAL LOW (ref 5–15)
BUN: 11 mg/dL (ref 6–20)
CHLORIDE: 104 mmol/L (ref 101–111)
CO2: 26 mmol/L (ref 22–32)
Calcium: 8.4 mg/dL — ABNORMAL LOW (ref 8.9–10.3)
Creatinine, Ser: 0.98 mg/dL (ref 0.61–1.24)
GFR calc Af Amer: >60 mL/min (ref >60)
GFR calc non Af Amer: >60 mL/min (ref >60)
GLUCOSE: 132 mg/dL — AB (ref 65–99)
POTASSIUM: 4.5 mmol/L (ref 3.5–5.1)
Sodium: 134 mmol/L — ABNORMAL LOW (ref 135–145)

## 2017-04-07 LAB — CBC
HEMATOCRIT: 44.3 % (ref 40.0–52.0)
Hemoglobin: 14.1 g/dL (ref 13.0–18.0)
MCH: 25.5 pg — ABNORMAL LOW (ref 26.0–34.0)
MCHC: 31.7 g/dL — ABNORMAL LOW (ref 32.0–36.0)
MCV: 80.2 fL (ref 80.0–100.0)
Platelets: 213 10*3/uL (ref 150–440)
RBC: 5.52 MIL/uL (ref 4.40–5.90)
RDW: 14.7 % — AB (ref 11.5–14.5)
WBC: 16.6 10*3/uL — ABNORMAL HIGH (ref 3.8–10.6)

## 2017-04-07 LAB — CMV IGM

## 2017-04-07 MED ORDER — METRONIDAZOLE 250 MG PO TABS: 250.0000 mg | ORAL_TABLET | Freq: Three times a day (TID) | ORAL | 0 refills | Status: DC

## 2017-04-07 MED ORDER — CIPROFLOXACIN HCL 500 MG PO TABS: 500.0000 mg | ORAL_TABLET | Freq: Two times a day (BID) | ORAL | 0 refills | Status: DC

## 2017-04-07 MED ORDER — METRONIDAZOLE 500 MG PO TABS
250.0000 mg | ORAL_TABLET | Freq: Three times a day (TID) | ORAL | Status: DC
  Administered 2017-04-07: 12:00:00 250 mg via ORAL
  Filled 2017-04-07: qty 1

## 2017-04-07 MED ORDER — CIPROFLOXACIN HCL 500 MG PO TABS: 500.0000 mg | ORAL_TABLET | Freq: Two times a day (BID) | ORAL | Status: DC

## 2017-04-07 MED ORDER — PREDNISONE 5 MG PO TABS: ORAL_TABLET | ORAL | 0 refills | Status: DC

## 2017-04-07 MED ORDER — PREDNISONE 20 MG PO TABS
40.0000 mg | ORAL_TABLET | Freq: Every day | ORAL | Status: DC
  Administered 2017-04-07: 40 mg via ORAL
  Filled 2017-04-07: qty 2

## 2017-04-07 NOTE — Discharge Instructions (Signed)
Crohn Disease Crohn disease is a long-lasting (chronic) disease that affects your gastrointestinal (GI) tract. It often causes irritation and swelling (inflammation) in your small intestine and the beginning of your large intestine. However, it can affect any part of your GI tract. Crohn disease is part of a group of illnesses that are known as inflammatory bowel disease (IBD). Crohn disease may start slowly and get worse over time. Symptoms may come and go. They may also disappear for months or even years at a time (remission). What are the causes? The exact cause of Crohn disease is not known. It may be a response that causes your body's defense system (immune system) to mistakenly attack healthy cells and tissues (autoimmune response). Your genes and your environment may also play a role. What increases the risk? You may be at greater risk for Crohn disease if you:  Have other family members with Crohn disease or another IBD.  Use any tobacco products, including cigarettes, chewing tobacco, or electronic cigarettes.  Are in your 32s.  Have [place] European ancestry. What are the signs or symptoms? The main signs and symptoms of Crohn disease involve your GI tract. These include:  Diarrhea.  Rectal bleeding.  An urgent need to move your bowels.  The feeling that you are not finished having a bowel movement.  Abdominal pain or cramping.  Constipation. General signs and symptoms of Crohn disease may also include:  Unexplained weight loss.  Fatigue.  Fever.  Nausea.  Loss of appetite.  Joint pain  Changes in vision.  Red bumps on your skin. How is this diagnosed? Your health care provider may suspect Crohn disease based on your symptoms and your medical history. Your health care provider will do a physical exam. You may need to see a health care provider who specializes in diseases of the digestive tract (gastroenterologist). You may also have tests to help your health  care providers make a diagnosis. These may include:  Blood tests.  Stool sample tests.  Imaging tests, such as X-rays and CT scans.  Tests to examine the inside of your intestines using a long, flexible tube that has a light and a camera on the end (endoscopy or colonoscopy).  A procedure to take tissue samples from inside your bowel (biopsy) to be examined under a microscope. How is this treated? There is no cure for Crohn disease. Treatment will focus on managing your symptoms. Crohn disease affects each person differently. Your treatment may include:  Resting your bowels. Drinking only clear liquids or getting nutrition through an IV for a period of time gives your bowels a chance to heal because they are not passing stools.  Medicines. These may be used alone or in combination (combination therapy). These may include antibiotic medicines. You may be given medicines that help to:  Reduce inflammation.  Control your immune system activity.  Fight infections.  Relieve cramps and prevent diarrhea.  Control your pain.  Surgery. You may need surgery if:  Medicines and other treatments are no longer working.  You develop complications from severe Crohn disease.  A section of your intestine becomes so damaged that it needs to be removed. Follow these instructions at home:  Take medicines only as directed by your health care provider.  If you were prescribed an antibiotic medicine, finish it all even if you start to feel better.  Keep all follow-up visits as directed by your health care provider. This is important.  Talk with your health care provider about changing  your diet. This may help your symptoms. Your health care provide may recommend changes, such as:  Drinking more fluids.  Avoiding milk and other foods that contain lactose.  Eating a low-fat diet.  Avoiding high-fiber foods, such as popcorn and nuts.  Avoiding carbonated beverages, such as soda.  Eating  smaller meals more often rather than eating large meals.  Keeping a food diary to identify foods that make your symptoms better or worse.  Do not use any tobacco products, including cigarettes, chewing tobacco, or electronic cigarettes. If you need help quitting, ask your health care provider.  Limit alcohol intake to no more than 1 drink per day for nonpregnant women and 2 drinks per day for men. One drink equals 12 ounces of beer, 5 ounces of wine, or 1 ounces of hard liquor.  Exercise daily or as directed by your health care provider. Contact a health care provider if:  You have diarrhea, abdominal cramps, and other gastrointestinal problems that are present almost all of the time.  Your symptoms do not improve with treatment.  You continue to lose weight.  You develop a rash or sores on your skin.  You develop eye problems.  You have a fever.  Your symptoms get worse.  You develop new symptoms. Get help right away if:  You have bloody diarrhea.  You develop severe abdominal pain.  You cannot pass stools. This information is not intended to replace advice given to you by your health care provider. Make sure you discuss any questions you have with your health care provider. Document Released: 09/20/2005 Document Revised: 04/20/2016 Document Reviewed: 07/29/2014 Elsevier Interactive Patient Education  2017 [employer].

## 2017-04-07 NOTE — Progress Notes (Signed)
Gastroenterology Progress Note    Daniel Bird 26 y.o. 26-Oct-1991   Subjective: Sitting up in bed. Eating solid food for lunch. Reports watery diarrhea overnight that has decreased in frequency since admit. No rectal bleeding overnight. Denies abdominal pain  Objective: Vital signs in last 24 hours: Vitals:   04/07/17 0830 04/07/17 1233  BP: (!) 98/57 112/66  Pulse: 66 77  Resp: 16 20  Temp: 97.4 F (36.3 C) 98 F (36.7 C)    Physical Exam: Gen: alert, no acute distress, thin CV: RRR  Chest: CTA B Abd: soft, nontender, nondistended, +BS Ext: no edema  Lab Results:  Recent Labs  04/06/17 0431 04/07/17 0535  NA 134* 134*  K 4.6 4.5  CL 103 104  CO2 25 26  GLUCOSE 134* 132*  BUN 9 11  CREATININE 0.98 0.98  CALCIUM 8.8* 8.4*    Recent Labs  04/05/17 0056  AST 16  ALT 12*  ALKPHOS 83  BILITOT 0.7  PROT 8.3*  ALBUMIN 4.0    Recent Labs  04/06/17 0431 04/07/17 0535  WBC 11.8* 16.6*  HGB 14.6 14.1  HCT 45.9 44.3  MCV 79.4* 80.2  PLT 214 213   No results for input(s): LABPROT, INR in the last 72 hours.    Assessment/Plan: Crohn's flare - improving with steroids. Also on Cipro/Flagyl. Stable to go home today and f/u Cimzia injection as previously scheduled (approximately 10 days from now). Advised to call Dr. Marton Redwood office on Monday to arrange f/u for 2-3 weeks or as previously scheduled.   Rein[place] C. 04/07/2017, 1:14 PM

## 2017-04-07 NOTE — Progress Notes (Signed)
Patient ID: Daniel Bird, male   DOB: [birth year], 26 y.o.   MRN: [id number] [place] [facility] [place] was admitted to the Hospital on 04/05/2017 and Discharged  04/07/2017 and should be excused from work/school   for 5  days starting 04/05/2017 , may return to work/school without any restrictions.  Loletha Grayer M.D on 04/07/2017,at 10:28 AM  [facility] [facility] [place]

## 2017-04-07 NOTE — Discharge Summary (Signed)
[facility] [place] NAME: Daniel Bird    MR#:  [id number]  DATE OF BIRTH:  18-Aug-1991  DATE OF ADMISSION:  04/05/2017 ADMITTING PHYSICIAN: Demetrios Loll, MD  DATE OF DISCHARGE: 04/07/2017  PRIMARY CARE PHYSICIAN: Maryland Pink, MD    ADMISSION DIAGNOSIS:  Colitis [K52.9] Ileitis [K52.9] Generalized abdominal pain [R10.84] Nausea vomiting and diarrhea [R11.2, R19.7] Crohn's disease with complication, unspecified gastrointestinal tract location ([place]) [K50.919]  DISCHARGE DIAGNOSIS:  Active Problems:   Colitis   SECONDARY DIAGNOSIS:   Past Medical History:  Diagnosis Date  . Allergic state   . Anemia    IRON DEFICIENCY ANEMIA  . Crohn's disease ([place])   . Eczema   . GERD (gastroesophageal reflux disease)   . Hemorrhoids   . Psoriasis     HOSPITAL COURSE:   1. Crohn's disease exacerbation. The patient was given IV steroids and Cipro and Flagyl IV. On the day of discharge he was feeling much better. His bloating was less. His diet was advanced. He was having bowel movements. Patient felt better and wanted to go home. Switched over to oral antibiotics for 5 more days and a prednisone taper. He will follow-up with Dr. Gustavo Lah gastroenterology as outpatient. He will go back on his usual agents for Crohn's disease. The patient is on a gluten-free diet. 2. GERD on omeprazole  DISCHARGE CONDITIONS:   Satisfactory  CONSULTS OBTAINED:   gastroenterology  DRUG ALLERGIES:   Allergies  Allergen Reactions  . Sulfamethoxazole-Trimethoprim Anaphylaxis  . Rifampin Other (See Comments)    Flu like illness    DISCHARGE MEDICATIONS:   Current Discharge Medication List    START taking these medications   Details  ciprofloxacin (CIPRO) 500 MG tablet Take 1 tablet (500 mg total) by mouth 2 (two) times daily. Qty: 10 tablet, Refills: 0    metroNIDAZOLE (FLAGYL) 250 MG tablet Take 1 tablet (250 mg total) by mouth every 8 (eight)  hours. Qty: 15 tablet, Refills: 0    predniSONE (DELTASONE) 5 MG tablet 8 tabs po day 1; 7 tabs po day2; 6 tabs po day3; 5 tabs po day4; 4 tabs po day5; 3 tabs po day6; 2 tabs po day7; 1 tab po day8 Qty: 36 tablet, Refills: 0      CONTINUE these medications which have NOT CHANGED   Details  budesonide (ENTOCORT EC) 3 MG 24 hr capsule Take 3 mg by mouth at bedtime.    Certolizumab Pegol 2 X 200 MG/ML KIT Inject into the skin every 14 (fourteen) days.     mesalamine (LIALDA) 1.2 G EC tablet Take 1.2 g by mouth daily.     omeprazole (PRILOSEC) 40 MG capsule Take 40 mg by mouth daily as needed.          DISCHARGE INSTRUCTIONS:   Follow-up with PMD 2 weeks Follow-up gastroenterology 1 week  If you experience worsening of your admission symptoms, develop shortness of breath, life threatening emergency, suicidal or homicidal thoughts you must seek medical attention immediately by calling 911 or calling your MD immediately  if symptoms less severe.  You Must read complete instructions/literature along with all the possible adverse reactions/side effects for all the Medicines you take and that have been prescribed to you. Take any new Medicines after you have completely understood and accept all the possible adverse reactions/side effects.   Please note  You were cared for by a hospitalist during your hospital stay. If you have any questions about your discharge  medications or the care you received while you were in the hospital after you are discharged, you can call the unit and asked to speak with the hospitalist on call if the hospitalist that took care of you is not available. Once you are discharged, your primary care physician will handle any further medical issues. Please note that NO REFILLS for any discharge medications will be authorized once you are discharged, as it is imperative that you return to your primary care physician (or establish a relationship with a primary care  physician if you do not have one) for your aftercare needs so that they can reassess your need for medications and monitor your lab values.    Today   CHIEF COMPLAINT:   Chief Complaint  Patient presents with  . Abdominal Pain  . Emesis  . Diarrhea    HISTORY OF PRESENT ILLNESS:  Daniel Bird  is a 26 y.o. male presented with abdominal pain nausea vomiting and diarrhea. He has a history of Crohn's disease and was admitted with Crohn's exacerbation   VITAL SIGNS:  Blood pressure 112/66, pulse 77, temperature 98 F (36.7 C), temperature source Oral, resp. rate 20, height 5' 6"  (1.676 m), weight 53.1 kg (117 lb), SpO2 99 %.    PHYSICAL EXAMINATION:  GENERAL:  26 y.o.-year-old patient lying in the bed with no acute distress.  EYES: Pupils equal, round, reactive to light and accommodation. No scleral icterus. Extraocular muscles intact.  HEENT: Head atraumatic, normocephalic. Oropharynx and nasopharynx clear.  NECK:  Supple, no jugular venous distention. No thyroid enlargement, no tenderness.  LUNGS: Normal breath sounds bilaterally, no wheezing, rales,rhonchi or crepitation. No use of accessory muscles of respiration.  CARDIOVASCULAR: S1, S2 normal. No murmurs, rubs, or gallops.  ABDOMEN: Soft, non-tender, non-distended. Bowel sounds present. No organomegaly or mass.  EXTREMITIES: No pedal edema, cyanosis, or clubbing.  NEUROLOGIC: Cranial nerves II through XII are intact. Muscle strength 5/5 in all extremities. Sensation intact. Gait not checked.  PSYCHIATRIC: The patient is alert and oriented x 3.  SKIN: No obvious rash, lesion, or ulcer.   DATA REVIEW:   CBC  Recent Labs Lab 04/07/17 0535  WBC 16.6*  HGB 14.1  HCT 44.3  PLT 213    Chemistries   Recent Labs Lab 04/05/17 0056  04/07/17 0535  NA 135  < > 134*  K 4.0  < > 4.5  CL 103  < > 104  CO2 24  < > 26  GLUCOSE 98  < > 132*  BUN 11  < > 11  CREATININE 1.14  < > 0.98  CALCIUM 9.2  < > 8.4*  AST 16   --   --   ALT 12*  --   --   ALKPHOS 83  --   --   BILITOT 0.7  --   --   < > = values in this interval not displayed.   Microbiology Results  Results for orders placed or performed during the hospital encounter of 04/05/17  Gastrointestinal Panel by PCR , Stool     Status: None   Collection Time: 04/05/17  5:01 PM  Result Value Ref Range Status   Campylobacter species NOT DETECTED NOT DETECTED Final   Plesimonas shigelloides NOT DETECTED NOT DETECTED Final   Salmonella species NOT DETECTED NOT DETECTED Final   Yersinia enterocolitica NOT DETECTED NOT DETECTED Final   Vibrio species NOT DETECTED NOT DETECTED Final   Vibrio cholerae NOT DETECTED NOT DETECTED Final   Enteroaggregative  E coli (EAEC) NOT DETECTED NOT DETECTED Final   Enteropathogenic E coli (EPEC) NOT DETECTED NOT DETECTED Final   Enterotoxigenic E coli (ETEC) NOT DETECTED NOT DETECTED Final   Shiga like toxin producing E coli (STEC) NOT DETECTED NOT DETECTED Final   Shigella/Enteroinvasive E coli (EIEC) NOT DETECTED NOT DETECTED Final   Cryptosporidium NOT DETECTED NOT DETECTED Final   Cyclospora cayetanensis NOT DETECTED NOT DETECTED Final   Entamoeba histolytica NOT DETECTED NOT DETECTED Final   Giardia lamblia NOT DETECTED NOT DETECTED Final   Adenovirus F40/41 NOT DETECTED NOT DETECTED Final   Astrovirus NOT DETECTED NOT DETECTED Final   Norovirus GI/GII NOT DETECTED NOT DETECTED Final   Rotavirus A NOT DETECTED NOT DETECTED Final   Sapovirus (I, II, IV, and V) NOT DETECTED NOT DETECTED Final  C difficile quick scan w PCR reflex     Status: None   Collection Time: 04/05/17  5:01 PM  Result Value Ref Range Status   C Diff antigen NEGATIVE NEGATIVE Final   C Diff toxin NEGATIVE NEGATIVE Final   C Diff interpretation No C. difficile detected.  Final    RADIOLOGY:  Dg Abd 1 View  Result Date: 04/06/2017 CLINICAL DATA:  Abdominal pain for 4 days. Personal history crest disease. EXAM: ABDOMEN - 1 VIEW  COMPARISON:  CT of the abdomen pelvis 04/05/2017. FINDINGS: Distention of distal small bowel is again seen. Small bowel is partially decompressed compared to yesterday's study. Contrast material is now seen throughout the colon which is decompressed. There is no free air. The lung bases are clear. Mild rightward curvature is present in the lumbar spine. IMPRESSION: 1. Partial decompression of small bowel. 2. Transit of oral contrast into the colon. Electronically Signed   By: San Morelle M.D.   On: 04/06/2017 09:54    Management plans discussed with the patient, And he is in agreement.  CODE STATUS:     Code Status Orders        Start     Ordered   04/05/17 1207  Full code  Continuous     04/05/17 1206    Code Status History    Date Active Date Inactive Code Status Order ID Comments User Context   This patient has a current code status but no historical code status.      TOTAL TIME TAKING CARE OF THIS PATIENT: 35 minutes.    Loletha Grayer M.D on 04/07/2017 at 1:46 PM  Between 7am to 6pm - Pager - [phone]  After 6pm go to [email or web address] - password [email or web address] [facility]  [phone]  CC: Primary care physician; [place] Pink, MD

## 2017-04-07 NOTE — Progress Notes (Signed)
Pt is being discharged today, discharge restrictions reviewed with him, he verified understanding. 3 paper prescriptions were given to him. IV removed, all belongings packed and returned to patient. He was rolled out in a wheelchair by staff.

## 2017-04-12 ENCOUNTER — Inpatient Hospital Stay: Payer: BLUE CROSS/BLUE SHIELD | Attending: Hematology and Oncology | Admitting: Hematology and Oncology

## 2017-04-12 ENCOUNTER — Encounter: Payer: Self-pay | Admitting: Hematology and Oncology

## 2017-04-12 VITALS — BP 102/63 | HR 88 | Temp 97.1°F | Resp 18 | Wt 118.2 lb

## 2017-04-12 DIAGNOSIS — Z7952 Long term (current) use of systemic steroids: Secondary | ICD-10-CM | POA: Diagnosis not present

## 2017-04-12 DIAGNOSIS — K219 Gastro-esophageal reflux disease without esophagitis: Secondary | ICD-10-CM | POA: Diagnosis not present

## 2017-04-12 DIAGNOSIS — D508 Other iron deficiency anemias: Secondary | ICD-10-CM | POA: Diagnosis not present

## 2017-04-12 DIAGNOSIS — Z882 Allergy status to sulfonamides status: Secondary | ICD-10-CM | POA: Diagnosis not present

## 2017-04-12 DIAGNOSIS — K269 Duodenal ulcer, unspecified as acute or chronic, without hemorrhage or perforation: Secondary | ICD-10-CM

## 2017-04-12 DIAGNOSIS — Z79899 Other long term (current) drug therapy: Secondary | ICD-10-CM | POA: Insufficient documentation

## 2017-04-12 DIAGNOSIS — D801 Nonfamilial hypogammaglobulinemia: Secondary | ICD-10-CM | POA: Diagnosis not present

## 2017-04-12 DIAGNOSIS — K5090 Crohn's disease, unspecified, without complications: Secondary | ICD-10-CM | POA: Insufficient documentation

## 2017-04-12 DIAGNOSIS — B370 Candidal stomatitis: Secondary | ICD-10-CM | POA: Diagnosis not present

## 2017-04-12 DIAGNOSIS — K2950 Unspecified chronic gastritis without bleeding: Secondary | ICD-10-CM

## 2017-04-12 DIAGNOSIS — D500 Iron deficiency anemia secondary to blood loss (chronic): Secondary | ICD-10-CM

## 2017-04-12 DIAGNOSIS — K2980 Duodenitis without bleeding: Secondary | ICD-10-CM | POA: Diagnosis not present

## 2017-04-12 MED ORDER — NYSTATIN 100000 UNIT/ML MT SUSP: 5.0000 mL | Freq: Four times a day (QID) | OROMUCOSAL | 0 refills | Status: DC

## 2017-04-12 NOTE — Progress Notes (Signed)
Patient discharged from hospital on Saturday.  Admitted for abdominal pain.  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 Currently on flagyl and cipro.  Offers no complaints. Today.

## 2017-04-12 NOTE — Progress Notes (Addendum)
[facility] day:  04/12/17   Chief Complaint: Daniel Bird is a 26 y.o. male with Crohn's disease and recurrent iron deficiency anemia who is seen for 3 month assessment.  HPI: The patient  was last seen in the medical oncology clinic on 10/31/2016.  At that time, he felt alright.  Exam was unremarkable.  Bowel movements were normal.  Hematocrit was 46.2.  RBCs were normocytic.  Ferritin was 24.  He underwent EGD and colonoscopy on 02/05/2017 by Dr. Gustavo Lah.  EGD revealed a normal esophagus, gastritis and multiple mucosal papules (nodules) in the stomach. There were multiple nonbleeding duodenal ulcers with white base.  Duodenal biopsy revealed moderate chronic active duodenitis with villous blunting and ulceration. There was no granulomatous or infectious agents.  The stomach revealed marked chronic active gastritis with increased intraepithelial lymphocytes. Pathology was negative for H. pylori. GE junction biopsy revealed gastric type columnar lined mucosa with moderate to marked chronic active inflammation, negative for H. pylori.  Colonoscopy on 02/05/2017 was grossly normal throughout with only scattered/small areas of minimal erythema. The ileocolonic anastomosis revealed mild inflammation. The ileal opening was stenotic with inability to advance the scope into the neoterminal ileum. Biopsies of the ileal side of the anastomosis revealed marked chronic active ileitis with erosion and 2 tiny poorly formed granulomas. Additional biopsies were negative for colitis in the ascending, transverse and right colon.  All samples were negative for dysplasia and malignancy.  [facility] labs on 03/01/2017 revealed a hematocrit of 47.1, hemoglobin 14.4, MCV 80, platelets 233,000, and WBC 9,100.  Ferritin was 19.  Iron saturation was 19% and TIBC 267.    He received Feraheme 510 mg IV on 03/20/2017.    He was admitted to [facility] from 04/05/2017 - 04/07/2017 with  generalized abdominal pain, and nausea and vomiting.  Abdomen and pelvic CT scan on 04/05/2017 revealed active Crohn ileitis within an abnormal long small bowel segment in the anterior pelvis, with associated functional mild small bowel obstruction.  There was active Crohn ileitis in the neo-terminal ileum and active Crohn colitis in the mid sigmoid colon.  There was no fistula, abscess or free air detected.  There was trace ascites.  He received IV steroids, ciprofloxacin and Flagyl.  He was scheduled to follow-up with Dr. Gustavo Lah.  CBC on discharge included a hematocrit of 44.3, hemoglobin 14.1, platelets 213,000, and WBC 16,600.  Symptomatically, he is doing well. He feels that his symptoms have been due to celiac disease. Transglutaminase IgG was 10 (> 9) with an IgA of <5 (90 - 386). He is going on a gluten-free diet.   Past Medical History:  Diagnosis Date  . Allergic state   . Anemia    IRON DEFICIENCY ANEMIA  . Crohn's disease ([place])   . Eczema   . GERD (gastroesophageal reflux disease)   . Hemorrhoids   . Psoriasis     Past Surgical History:  Procedure Laterality Date  . APPENDECTOMY     May 2014  . COLONOSCOPY    . COLONOSCOPY WITH PROPOFOL N/A 02/05/2017   Procedure: COLONOSCOPY WITH PROPOFOL;  Surgeon: Lollie Sails, MD;  Location: [facility] ENDOSCOPY;  Service: Endoscopy;  Laterality: N/A;  . ESOPHAGOGASTRODUODENOSCOPY    . ESOPHAGOGASTRODUODENOSCOPY N/A 02/05/2017   Procedure: ESOPHAGOGASTRODUODENOSCOPY (EGD);  Surgeon: Lollie Sails, MD;  Location[facility] ENDOSCOPY;  Service: Endoscopy;  Laterality: N/A;   History reviewed. No pertinent family history.  He is an only child.  Social History:  reports that he has never smoked. He has never used smokeless tobacco. He reports that he does not drink alcohol or use drugs.  The patient is accompanied by his great aunt, Vaughan Basta.  He lives with his grandmother.  He completed college.  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    Allergies:  Allergies  Allergen Reactions  . Sulfamethoxazole-Trimethoprim Anaphylaxis  . Rifampin Other (See Comments)    Flu like illness    Current Medications: Current Outpatient Prescriptions  Medication Sig Dispense Refill  . budesonide (ENTOCORT EC) 3 MG 24 hr capsule Take 3 mg by mouth at bedtime.    . Certolizumab Pegol 2 X 200 MG/ML KIT Inject into the skin every 14 (fourteen) days.     . ciprofloxacin (CIPRO) 500 MG tablet Take 1 tablet (500 mg total) by mouth 2 (two) times daily. 10 tablet 0  . fluticasone (FLONASE) 50 MCG/ACT nasal spray Place 1 spray into the nose daily.    . mesalamine (LIALDA) 1.2 G EC tablet Take 1.2 g by mouth daily.     . metroNIDAZOLE (FLAGYL) 250 MG tablet Take 1 tablet (250 mg total) by mouth every 8 (eight) hours. 15 tablet 0  . omeprazole (PRILOSEC) 40 MG capsule Take 40 mg by mouth daily as needed.     . predniSONE (DELTASONE) 5 MG tablet 8 tabs po day 1; 7 tabs po day2; 6 tabs po day3; 5 tabs po day4; 4 tabs po day5; 3 tabs po day6; 2 tabs po day7; 1 tab po day8 36 tablet 0   No current facility-administered medications for this visit.     Review of Systems:  GENERAL:  Feels "alright".  No fevers or sweats.  Weight down 4 pounds.  Weight is normally 121 pounds (lowest 116 pounds). PERFORMANCE STATUS (ECOG):  1 HEENT:  Teeth affected.  No visual changes, runny nose, sore throat, mouth sores or tenderness. Lungs: No shortness of breath or cough.  No hemoptysis. Cardiac:  No chest pain, palpitations, orthopnea, or PND. GI:  Eating well.  Normal formed daily bowel movements (3x/day).  No nausea, vomiting, diarrhea, constipation, melena or hematochezia. GU:  No urgency, frequency, dysuria, or hematuria. Musculoskeletal:  No back pain.  No joint pain.  No muscle tenderness. Extremities:  No pain or swelling. Skin:  No rashes or skin changes. Neuro:  No headache, numbness or weakness,  balance or coordination issues. Endocrine:  No diabetes, thyroid issues, hot flashes or night sweats. Psych:  No mood changes, depression or anxiety. Pain:  No focal pain. Review of systems:  All other systems reviewed and found to be negative.  Physical Exam: Blood pressure 102/63, pulse 88, temperature 97.1 F (36.2 C), temperature source Tympanic, resp. rate 18, weight 118 lb 3 oz (53.6 kg). GENERAL:  Thin gentleman sitting comfortably in the exam room in no acute distress. MENTAL STATUS:  Alert and oriented to person, place and time. HEAD:  Dark curly hair.  Temporal wasting.  Normocephalic, atraumatic, face symmetric, no Cushingoid features. EYES:  Glasses.  Brown eyes.  Left eye injected.  Pupils equal round and reactive to light and accomodation.  No scleral icterus. ENT:  Thrush.  Dentures.  Tongue normal. Mucous membranes moist.  RESPIRATORY:  Clear to auscultation without rales, wheezes or rhonchi. CARDIOVASCULAR:  Regular rate and rhythm without murmur, rub or gallop. ABDOMEN:  Well healed midline incision.  Soft, non-tender, with active bowel sounds, and no hepatosplenomegaly.  No  masses. SKIN:  No rashes, ulcers or lesions. EXTREMITIES: No edema, no skin discoloration or tenderness.  No palpable cords. LYMPH NODES: No palpable cervical, supraclavicular, axillary or inguinal adenopathy  NEUROLOGICAL: Unremarkable. PSYCH:  Appropriate.   No visits with results within 3 Day(s) from this visit.  Latest known visit with results is:  Admission on 04/05/2017, Discharged on 04/07/2017  Component Date Value Ref Range Status  . Lipase 04/05/2017 14  11 - 51 U/L Final  . Sodium 04/05/2017 135  135 - 145 mmol/L Final  . Potassium 04/05/2017 4.0  3.5 - 5.1 mmol/L Final  . Chloride 04/05/2017 103  101 - 111 mmol/L Final  . CO2 04/05/2017 24  22 - 32 mmol/L Final  . Glucose, Bld 04/05/2017 98  65 - 99 mg/dL Final  . BUN 04/05/2017 11  6 - 20 mg/dL Final  . Creatinine, Ser 04/05/2017  1.14  0.61 - 1.24 mg/dL Final  . Calcium 04/05/2017 9.2  8.9 - 10.3 mg/dL Final  . Total Protein 04/05/2017 8.3* 6.5 - 8.1 g/dL Final  . Albumin 04/05/2017 4.0  3.5 - 5.0 g/dL Final  . AST 04/05/2017 16  15 - 41 U/L Final  . ALT 04/05/2017 12* 17 - 63 U/L Final  . Alkaline Phosphatase 04/05/2017 83  38 - 126 U/L Final  . Total Bilirubin 04/05/2017 0.7  0.3 - 1.2 mg/dL Final  . GFR calc non Af Amer 04/05/2017 >60  >60 mL/min Final  . GFR calc Af Amer 04/05/2017 >60  >60 mL/min Final   Comment: (NOTE) The eGFR has been calculated using the CKD EPI equation. This calculation has not been validated in all clinical situations. eGFR's persistently <60 mL/min signify possible Chronic Kidney Disease.   . Anion gap 04/05/2017 8  5 - 15 Final  . WBC 04/05/2017 18.6* 3.8 - 10.6 K/uL Final  . RBC 04/05/2017 6.68* 4.40 - 5.90 MIL/uL Final  . Hemoglobin 04/05/2017 16.6  13.0 - 18.0 g/dL Final  . HCT 04/05/2017 52.6* 40.0 - 52.0 % Final  . MCV 04/05/2017 78.8* 80.0 - 100.0 fL Final  . MCH 04/05/2017 24.8* 26.0 - 34.0 pg Final  . MCHC 04/05/2017 31.5* 32.0 - 36.0 g/dL Final  . RDW 04/05/2017 15.4* 11.5 - 14.5 % Final  . Platelets 04/05/2017 257  150 - 440 K/uL Final  . Color, Urine 04/05/2017 AMBER* YELLOW Final  . APPearance 04/05/2017 CLEAR* CLEAR Final  . Specific Gravity, Urine 04/05/2017 1.031* 1.005 - 1.030 Final  . pH 04/05/2017 5.0  5.0 - 8.0 Final  . Glucose, UA 04/05/2017 NEGATIVE  NEGATIVE mg/dL Final  . Hgb urine dipstick 04/05/2017 NEGATIVE  NEGATIVE Final  . Bilirubin Urine 04/05/2017 NEGATIVE  NEGATIVE Final  . Ketones, ur 04/05/2017 20* NEGATIVE mg/dL Final  . Protein, ur 04/05/2017 30* NEGATIVE mg/dL Final  . Nitrite 04/05/2017 NEGATIVE  NEGATIVE Final  . Leukocytes, UA 04/05/2017 NEGATIVE  NEGATIVE Final  . RBC / HPF 04/05/2017 0-5  0 - 5 RBC/hpf Final  . WBC, UA 04/05/2017 0-5  0 - 5 WBC/hpf Final  . Bacteria, UA 04/05/2017 NONE SEEN  NONE SEEN Final  . Squamous Epithelial  / LPF 04/05/2017 0-5* NONE SEEN Final  . Mucous 04/05/2017 PRESENT   Final  . HIV Screen 4th Generation wRfx 04/05/2017 Non Reactive  Non Reactive Final   Comment: (NOTE) Performed At: [facility] [place], [place] [id number] Lindon Romp MD MG:[id number]   . Campylobacter species 04/05/2017 NOT DETECTED  NOT  DETECTED Final  . Plesimonas shigelloides 04/05/2017 NOT DETECTED  NOT DETECTED Final  . Salmonella species 04/05/2017 NOT DETECTED  NOT DETECTED Final  . Yersinia enterocolitica 04/05/2017 NOT DETECTED  NOT DETECTED Final  . Vibrio species 04/05/2017 NOT DETECTED  NOT DETECTED Final  . Vibrio cholerae 04/05/2017 NOT DETECTED  NOT DETECTED Final  . Enteroaggregative E coli (EAEC) 04/05/2017 NOT DETECTED  NOT DETECTED Final  . Enteropathogenic E coli (EPEC) 04/05/2017 NOT DETECTED  NOT DETECTED Final  . Enterotoxigenic E coli (ETEC) 04/05/2017 NOT DETECTED  NOT DETECTED Final  . Shiga like toxin producing E coli * 04/05/2017 NOT DETECTED  NOT DETECTED Final  . Shigella/Enteroinvasive E coli (EI* 04/05/2017 NOT DETECTED  NOT DETECTED Final  . Cryptosporidium 04/05/2017 NOT DETECTED  NOT DETECTED Final  . Cyclospora cayetanensis 04/05/2017 NOT DETECTED  NOT DETECTED Final  . Entamoeba histolytica 04/05/2017 NOT DETECTED  NOT DETECTED Final  . Giardia lamblia 04/05/2017 NOT DETECTED  NOT DETECTED Final  . Adenovirus F40/41 04/05/2017 NOT DETECTED  NOT DETECTED Final  . Astrovirus 04/05/2017 NOT DETECTED  NOT DETECTED Final  . Norovirus GI/GII 04/05/2017 NOT DETECTED  NOT DETECTED Final  . Rotavirus A 04/05/2017 NOT DETECTED  NOT DETECTED Final  . Sapovirus (I, II, IV, and V) 04/05/2017 NOT DETECTED  NOT DETECTED Final  . C Diff antigen 04/05/2017 NEGATIVE  NEGATIVE Final  . C Diff toxin 04/05/2017 NEGATIVE  NEGATIVE Final  . C Diff interpretation 04/05/2017 No C. difficile detected.   Final  . Sed Rate 04/05/2017 1  0 - 15 mm/hr Final  . CRP 04/05/2017 4.7*  <1.0 mg/dL Final  . CMV Ab - IgG 04/05/2017 <0.60  0.00 - 0.59 U/mL Final   Comment: (NOTE)                               Negative          <0.60                               Equivocal   0.60 - 0.69                               Positive          >0.69 Performed At: [facility] [place], [place] [id number] Lindon Romp MD JG:[id number]   . Sodium 04/06/2017 134* 135 - 145 mmol/L Final  . Potassium 04/06/2017 4.6  3.5 - 5.1 mmol/L Final  . Chloride 04/06/2017 103  101 - 111 mmol/L Final  . CO2 04/06/2017 25  22 - 32 mmol/L Final  . Glucose, Bld 04/06/2017 134* 65 - 99 mg/dL Final  . BUN 04/06/2017 9  6 - 20 mg/dL Final  . Creatinine, Ser 04/06/2017 0.98  0.61 - 1.24 mg/dL Final  . Calcium 04/06/2017 8.8* 8.9 - 10.3 mg/dL Final  . GFR calc non Af Amer 04/06/2017 >60  >60 mL/min Final  . GFR calc Af Amer 04/06/2017 >60  >60 mL/min Final   Comment: (NOTE) The eGFR has been calculated using the CKD EPI equation. This calculation has not been validated in all clinical situations. eGFR's persistently <60 mL/min signify possible Chronic Kidney Disease.   . Anion gap 04/06/2017 6  5 - 15 Final  . WBC 04/06/2017 11.8* 3.8 - 10.6 K/uL Final  .  RBC 04/06/2017 5.78  4.40 - 5.90 MIL/uL Final  . Hemoglobin 04/06/2017 14.6  13.0 - 18.0 g/dL Final  . HCT 04/06/2017 45.9  40.0 - 52.0 % Final  . MCV 04/06/2017 79.4* 80.0 - 100.0 fL Final  . MCH 04/06/2017 25.3* 26.0 - 34.0 pg Final  . MCHC 04/06/2017 31.9* 32.0 - 36.0 g/dL Final  . RDW 04/06/2017 15.2* 11.5 - 14.5 % Final  . Platelets 04/06/2017 214  150 - 440 K/uL Final  . CMV IgM 04/06/2017 <30.0  0.0 - 29.9 AU/mL Final   Comment: (NOTE)                                Negative         <30.0                                Equivocal  30.0 - 34.9                                Positive         >34.9 A positive result is generally indicative of acute infection, reactivation or persistent IgM production. Performed At:  [facility] [place], [place] [id number] Lindon Romp MD YP:[id number]   . Sodium 04/07/2017 134* 135 - 145 mmol/L Final  . Potassium 04/07/2017 4.5  3.5 - 5.1 mmol/L Final  . Chloride 04/07/2017 104  101 - 111 mmol/L Final  . CO2 04/07/2017 26  22 - 32 mmol/L Final  . Glucose, Bld 04/07/2017 132* 65 - 99 mg/dL Final  . BUN 04/07/2017 11  6 - 20 mg/dL Final  . Creatinine, Ser 04/07/2017 0.98  0.61 - 1.24 mg/dL Final  . Calcium 04/07/2017 8.4* 8.9 - 10.3 mg/dL Final  . GFR calc non Af Amer 04/07/2017 >60  >60 mL/min Final  . GFR calc Af Amer 04/07/2017 >60  >60 mL/min Final   Comment: (NOTE) The eGFR has been calculated using the CKD EPI equation. This calculation has not been validated in all clinical situations. eGFR's persistently <60 mL/min signify possible Chronic Kidney Disease.   . Anion gap 04/07/2017 4* 5 - 15 Final  . WBC 04/07/2017 16.6* 3.8 - 10.6 K/uL Final  . RBC 04/07/2017 5.52  4.40 - 5.90 MIL/uL Final  . Hemoglobin 04/07/2017 14.1  13.0 - 18.0 g/dL Final  . HCT 04/07/2017 44.3  40.0 - 52.0 % Final  . MCV 04/07/2017 80.2  80.0 - 100.0 fL Final  . MCH 04/07/2017 25.5* 26.0 - 34.0 pg Final  . MCHC 04/07/2017 31.7* 32.0 - 36.0 g/dL Final  . RDW 04/07/2017 14.7* 11.5 - 14.5 % Final  . Platelets 04/07/2017 213  150 - 440 K/uL Final    Assessment:  Daniel Bird is a 26 y.o. male with Crohn's disease and recurrent iron deficiency anemia.  He received IV iron 3-4 times in 2015 while living in [place].  Colonoscopy in 2015 was negative per patient report.  He may have celiac disease.  Transglutaminase IgG was 10 (> 9) with an IgA of <5 (90 - 386).   EGD on 02/05/2017 revealed a normal esophagus, gastritis and multiple mucosal papules (nodules) in the stomach. There were multiple nonbleeding duodenal ulcers with white base.  Duodenal biopsy revealed moderate chronic active duodenitis with villous  blunting and ulceration. There was no  granulomatous or infectious agents.  The stomach revealed marked chronic active gastritis.   Colonoscopy on 02/05/2017 revealed scattered/small areas of minimal erythema. The ileocolonic anastomosis revealed mild inflammation. The ileal opening was stenotic with inability to advance the scope into the neoterminal ileum. Biopsies of the ileal side of the anastomosis revealed marked chronic active ileitis with erosion and 2 tiny poorly formed granulomas. Additional biopsies were negative for colitis in the ascending, transverse and right colon.  All samples were negative for dysplasia and malignancy.  He was admitted to [facility] from 04/05/2017 - 04/07/2017 with generalized abdominal pain, and nausea and vomiting.  Abdomen and pelvic CT scan on 04/05/2017 revealed active Crohn ileitis within an abnormal long small bowel segment in the anterior pelvis, with associated functional mild small bowel obstruction.  There was active Crohn ileitis in the neo-terminal ileum and active Crohn colitis in the mid sigmoid colon.  There was no fistula, abscess or free air detected.  There was trace ascites.  He has received Feraheme on several occasions (08/27/2015, 11/12/2015, 02/04/2016, and 03/20/2017).  Symptomatically, he is doing well.  Exam reveals thrush.  He is going on a gluten-free diet.  Hematocrit was 44.3 on 04/07/2017.     Plan: 1.  Review [facility] labs and results from recent endoscopies. 2.  [facility] labs in 1 week and prior to next appt.  Check immunoglobulins (IgG, IgA, IgM, IgE). 3.  Rx:  Nystatin swish and spit. 4.  RTC in 3 months for MD assessment, review of labs, and +/- Feraheme.   Lequita Asal, MD  04/12/2017, 12:05 PM

## 2017-05-04 ENCOUNTER — Other Ambulatory Visit: Payer: Self-pay | Admitting: *Deleted

## 2017-05-04 DIAGNOSIS — B370 Candidal stomatitis: Secondary | ICD-10-CM | POA: Insufficient documentation

## 2017-05-04 DIAGNOSIS — D500 Iron deficiency anemia secondary to blood loss (chronic): Secondary | ICD-10-CM

## 2017-05-04 DIAGNOSIS — D801 Nonfamilial hypogammaglobulinemia: Secondary | ICD-10-CM | POA: Insufficient documentation

## 2017-05-15 ENCOUNTER — Other Ambulatory Visit: Payer: Self-pay | Admitting: *Deleted

## 2017-07-12 ENCOUNTER — Inpatient Hospital Stay: Payer: BLUE CROSS/BLUE SHIELD

## 2017-07-12 ENCOUNTER — Encounter: Payer: Self-pay | Admitting: Internal Medicine

## 2017-07-12 ENCOUNTER — Inpatient Hospital Stay: Payer: BLUE CROSS/BLUE SHIELD | Attending: Hematology and Oncology | Admitting: Hematology and Oncology

## 2017-07-12 ENCOUNTER — Encounter: Payer: Self-pay | Admitting: Hematology and Oncology

## 2017-07-12 VITALS — BP 102/68 | HR 79 | Temp 97.1°F | Resp 18 | Wt 121.0 lb

## 2017-07-12 DIAGNOSIS — K5090 Crohn's disease, unspecified, without complications: Secondary | ICD-10-CM

## 2017-07-12 DIAGNOSIS — Z882 Allergy status to sulfonamides status: Secondary | ICD-10-CM | POA: Diagnosis not present

## 2017-07-12 DIAGNOSIS — Z7952 Long term (current) use of systemic steroids: Secondary | ICD-10-CM | POA: Insufficient documentation

## 2017-07-12 DIAGNOSIS — D801 Nonfamilial hypogammaglobulinemia: Secondary | ICD-10-CM

## 2017-07-12 DIAGNOSIS — L409 Psoriasis, unspecified: Secondary | ICD-10-CM | POA: Diagnosis not present

## 2017-07-12 DIAGNOSIS — K219 Gastro-esophageal reflux disease without esophagitis: Secondary | ICD-10-CM | POA: Diagnosis not present

## 2017-07-12 DIAGNOSIS — D508 Other iron deficiency anemias: Secondary | ICD-10-CM | POA: Insufficient documentation

## 2017-07-12 DIAGNOSIS — Z79899 Other long term (current) drug therapy: Secondary | ICD-10-CM

## 2017-07-12 DIAGNOSIS — D500 Iron deficiency anemia secondary to blood loss (chronic): Secondary | ICD-10-CM

## 2017-07-12 LAB — CBC WITH DIFFERENTIAL/PLATELET
Basophils Absolute: 0 10*3/uL (ref 0–0.1)
Basophils Relative: 1 %
Eosinophils Absolute: 0.1 10*3/uL (ref 0–0.7)
Eosinophils Relative: 1 %
HCT: 46.7 % (ref 40.0–52.0)
Hemoglobin: 15.1 g/dL (ref 13.0–18.0)
Lymphocytes Relative: 19 %
Lymphs Abs: 1.5 10*3/uL (ref 1.0–3.6)
MCH: 25.9 pg — ABNORMAL LOW (ref 26.0–34.0)
MCHC: 32.3 g/dL (ref 32.0–36.0)
MCV: 80.1 fL (ref 80.0–100.0)
Monocytes Absolute: 0.6 10*3/uL (ref 0.2–1.0)
Monocytes Relative: 8 %
Neutro Abs: 5.7 10*3/uL (ref 1.4–6.5)
Neutrophils Relative %: 71 %
Platelets: 209 10*3/uL (ref 150–440)
RBC: 5.83 MIL/uL (ref 4.40–5.90)
RDW: 14.7 % — ABNORMAL HIGH (ref 11.5–14.5)
WBC: 8 10*3/uL (ref 3.8–10.6)

## 2017-07-12 LAB — FERRITIN: Ferritin: 28 ng/mL (ref 24–336)

## 2017-07-12 NOTE — Progress Notes (Signed)
[facility] day:  07/12/2017   Chief Complaint: Daniel Bird is a 26 y.o. male with Crohn's disease and recurrent iron deficiency anemia who is seen for 3 month assessment.  HPI: The patient  was last seen in the medical oncology clinic on 04/12/2017.  At that time, he felt well.  Exam revealed thrush.  He was going on a gluten-free diet.  Hematocrit was 44.3 on 04/07/2017. He was prescribed Nystatin swish and spit.  During the interim, he has remained on a gluten-free diet. He has an endoscopy scheduled for 08/2017. He denies any colon issues.   Past Medical History:  Diagnosis Date  . Allergic state   . Anemia    IRON DEFICIENCY ANEMIA  . Crohn's disease ([place])   . Eczema   . GERD (gastroesophageal reflux disease)   . Hemorrhoids   . Psoriasis     Past Surgical History:  Procedure Laterality Date  . APPENDECTOMY     May 2014  . COLONOSCOPY    . COLONOSCOPY WITH PROPOFOL N/A 02/05/2017   Procedure: COLONOSCOPY WITH PROPOFOL;  Surgeon: Lollie Sails, MD;  Location: [facility] ENDOSCOPY;  Service: Endoscopy;  Laterality: N/A;  . ESOPHAGOGASTRODUODENOSCOPY    . ESOPHAGOGASTRODUODENOSCOPY N/A 02/05/2017   Procedure: ESOPHAGOGASTRODUODENOSCOPY (EGD);  Surgeon: Lollie Sails, MD;  Location: [facility] ENDOSCOPY;  Service: Endoscopy;  Laterality: N/A;   History reviewed. No pertinent family history.  He is an only child.  Social History:  reports that he has never smoked. He has never used smokeless tobacco. He reports that he does not drink alcohol or use drugs.  The patient is accompanied by his great aunt, Vaughan Basta.  He lives with his grandmother.  He completed college.  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  Cell number is : [phone].  He is accompanied by his grandmother.  Allergies:  Allergies  Allergen Reactions  . Sulfamethoxazole-Trimethoprim Anaphylaxis  . Rifampin Other (See  Comments)    Flu like illness    Current Medications: Current Outpatient Prescriptions  Medication Sig Dispense Refill  . budesonide (ENTOCORT EC) 3 MG 24 hr capsule Take 3 mg by mouth at bedtime.    . Certolizumab Pegol 2 X 200 MG/ML KIT Inject into the skin every 14 (fourteen) days.     . ciprofloxacin (CIPRO) 500 MG tablet Take 1 tablet (500 mg total) by mouth 2 (two) times daily. 10 tablet 0  . fluticasone (FLONASE) 50 MCG/ACT nasal spray Place 1 spray into the nose daily.    . mesalamine (LIALDA) 1.2 G EC tablet Take 1.2 g by mouth daily.     . metroNIDAZOLE (FLAGYL) 250 MG tablet Take 1 tablet (250 mg total) by mouth every 8 (eight) hours. 15 tablet 0  . nystatin (MYCOSTATIN) 100000 UNIT/ML suspension Take 5 mLs (500,000 Units total) by mouth 4 (four) times daily. Swiss and spit 60 mL 0  . omeprazole (PRILOSEC) 40 MG capsule Take 40 mg by mouth daily as needed.     . predniSONE (DELTASONE) 5 MG tablet 8 tabs po day 1; 7 tabs po day2; 6 tabs po day3; 5 tabs po day4; 4 tabs po day5; 3 tabs po day6; 2 tabs po day7; 1 tab po day8 36 tablet 0  . Iron Dextran (INFED IJ) Inject 100 mg into the vein every 3 (three) months.     No current facility-administered medications for this visit.     Review  of Systems:  GENERAL:  Feels "fine".  No fevers or sweats.  Weight up 3 pounds.  Weight is normally 121 pounds (today's weight). PERFORMANCE STATUS (ECOG):  1 HEENT:  Teeth affected.  No visual changes, runny nose, sore throat, mouth sores or tenderness. Lungs: No shortness of breath or cough.  No hemoptysis. Cardiac:  No chest pain, palpitations, orthopnea, or PND. GI:  Eating well.  Normal formed daily bowel movements (3x/day).  No nausea, vomiting, diarrhea, constipation, melena or hematochezia. GU:  No urgency, frequency, dysuria, or hematuria. Musculoskeletal:  No back pain.  No joint pain.  No muscle tenderness. Extremities:  No pain or swelling. Skin:  No rashes or skin changes. Neuro:   No headache, numbness or weakness, balance or coordination issues. Endocrine:  No diabetes, thyroid issues, hot flashes or night sweats. Psych:  No mood changes, depression or anxiety. Pain:  No focal pain. Review of systems:  All other systems reviewed and found to be negative.  Physical Exam: Blood pressure 102/68, pulse 79, temperature (!) 97.1 F (36.2 C), temperature source Tympanic, resp. rate 18, weight 121 lb (54.9 kg). GENERAL:  Thin gentleman sitting comfortably in the exam room in no acute distress. MENTAL STATUS:  Alert and oriented to person, place and time. HEAD:  Dark curly hair.  Temporal wasting.  Normocephalic, atraumatic, face symmetric, no Cushingoid features. EYES:  Glasses.  Brown eyes.  Left eye injected.  Pupils equal round and reactive to light and accomodation.  No scleral icterus. ENT:  Thrush.  Dentures.  Tongue normal. Mucous membranes moist.  RESPIRATORY:  Clear to auscultation without rales, wheezes or rhonchi. CARDIOVASCULAR:  Regular rate and rhythm without murmur, rub or gallop. ABDOMEN:  Well healed midline incision.  Soft, non-tender, with active bowel sounds, and no hepatosplenomegaly.  No masses. SKIN:  No rashes, ulcers or lesions. EXTREMITIES: No edema, no skin discoloration or tenderness.  No palpable cords. LYMPH NODES: No palpable cervical, supraclavicular, axillary or inguinal adenopathy  NEUROLOGICAL: Unremarkable. PSYCH:  Appropriate.   Appointment on 07/12/2017  Component Date Value Ref Range Status  . IgG (Immunoglobin G), Serum 07/12/2017 1819* 700 - 1,600 mg/dL Final  . IgA 07/12/2017 <5* 90 - 386 mg/dL Final   Result confirmed on concentration.  . IgM, Serum 07/12/2017 62  20 - 172 mg/dL Final   Comment: (NOTE) Performed At: [facility] [address] [place], [place] [id number] [place] William F MD Ph:[phone]   . IgE (Immunoglobulin E), Serum 07/12/2017 14  0 - 100 IU/mL Final   Comment: (NOTE) Performed At: [facility] [address] , [place] [id number] [place] William F MD Ph:[phone]   . WBC 07/12/2017 8.0  3.8 - 10.6 K/uL Final  . RBC 07/12/2017 5.83  4.40 - 5.90 MIL/uL Final  . Hemoglobin 07/12/2017 15.1  13.0 - 18.0 g/dL Final  . HCT 07/12/2017 46.7  40.0 - 52.0 % Final  . MCV 07/12/2017 80.1  80.0 - 100.0 fL Final  . MCH 07/12/2017 25.9* 26.0 - 34.0 pg Final  . MCHC 07/12/2017 32.3  32.0 - 36.0 g/dL Final  . RDW 07/12/2017 14.7* 11.5 - 14.5 % Final  . Platelets 07/12/2017 209  150 - 440 K/uL Final  . Neutrophils Relative % 07/12/2017 71  % Final  . Neutro Abs 07/12/2017 5.7  1.4 - 6.5 K/uL Final  . Lymphocytes Relative 07/12/2017 19  % Final  . Lymphs Abs 07/12/2017 1.5  1.0 - 3.6 K/uL Final  . Monocytes Relative 07/12/2017   8  % Final  . Monocytes Absolute 07/12/2017 0.6  0.2 - 1.0 K/uL Final  . Eosinophils Relative 07/12/2017 1  % Final  . Eosinophils Absolute 07/12/2017 0.1  0 - 0.7 K/uL Final  . Basophils Relative 07/12/2017 1  % Final  . Basophils Absolute 07/12/2017 0.0  0 - 0.1 K/uL Final  . Ferritin 07/12/2017 28  24 - 336 ng/mL Final    Assessment:  Oakes Marcum is a 26 y.o. male with Crohn's disease and recurrent iron deficiency anemia.  He received IV iron 3-4 times in 2015 while living in [place].  Colonoscopy in 2015 was negative per patient report.  He may have celiac disease.  Transglutaminase IgG was 10 (> 9) with an IgA of <5 (90 - 386).   EGD on 02/05/2017 revealed a normal esophagus, gastritis and multiple mucosal papules (nodules) in the stomach. There were multiple nonbleeding duodenal ulcers with white base.  Duodenal biopsy revealed moderate chronic active duodenitis with villous blunting and ulceration. There was no granulomatous or infectious agents.  The stomach revealed marked chronic active gastritis.   Colonoscopy on 02/05/2017 revealed scattered/small areas of minimal erythema. The ileocolonic anastomosis revealed mild inflammation.  The ileal opening was stenotic with inability to advance the scope into the neoterminal ileum. Biopsies of the ileal side of the anastomosis revealed marked chronic active ileitis with erosion and 2 tiny poorly formed granulomas. Additional biopsies were negative for colitis in the ascending, transverse and right colon.  All samples were negative for dysplasia and malignancy.  He was admitted to [facility] from 04/05/2017 - 04/07/2017 with generalized abdominal pain, and nausea and vomiting.  Abdomen and pelvic CT scan on 04/05/2017 revealed active Crohn ileitis within an abnormal long small bowel segment in the anterior pelvis, with associated functional mild small bowel obstruction.  There was active Crohn ileitis in the neo-terminal ileum and active Crohn colitis in the mid sigmoid colon.  There was no fistula, abscess or free air detected.  There was trace ascites.  He has received Feraheme on several occasions (08/27/2015, 11/12/2015, 02/04/2016, and 03/20/2017).  Ferritin has been followed: 10 on 05/18/2015, 43 on 04/27/2016, 33 on 07/25/2016, 24 on 10/19/2016, 19 on 03/01/2017, and 28 on 07/12/2017  Symptomatically, he is doing well.  Exam is stable.  He is going on a gluten-free diet.  Hematocrit is 46.7.  MCV is 80.1 (normal).  Plan: 1.  Labs today:  CBC with diff, ferritin, immunoglobulins. 2.  RTC in 3 months for labs (CBC with diff, ferritin). 3.  RTC in 6 months for MD assessment and labs (CBC with diff, ferritin-day before), and +/- Feraheme.   Melissa C Corcoran, MD  07/12/2017, 2:22 PM  

## 2017-07-12 NOTE — Progress Notes (Signed)
Patient offers no complaints today. 

## 2017-07-13 LAB — IGG, IGA, IGM
IgA: <5 mg/dL — ABNORMAL LOW (ref 90–386)
IgG (Immunoglobin G), Serum: 1819 mg/dL — ABNORMAL HIGH (ref 700–1600)
IgM, Serum: 62 mg/dL (ref 20–172)

## 2017-07-15 LAB — IGE: IgE (Immunoglobulin E), Serum: 14 IU/mL (ref 0–100)

## 2017-09-26 ENCOUNTER — Encounter: Payer: Self-pay | Admitting: *Deleted

## 2017-09-27 ENCOUNTER — Encounter
Admission: RE | Disposition: A | Discharge status: Home / Self Care | Payer: Self-pay | Source: Ambulatory Visit | Attending: Gastroenterology

## 2017-09-27 ENCOUNTER — Ambulatory Visit: Payer: BLUE CROSS/BLUE SHIELD | Admitting: Anesthesiology

## 2017-09-27 ENCOUNTER — Ambulatory Visit
Admission: RE | Admit: 2017-09-27 | Discharge: 2017-09-27 | Disposition: A | Discharge status: Home / Self Care | Payer: BLUE CROSS/BLUE SHIELD | Source: Ambulatory Visit | Attending: Gastroenterology | Admitting: Gastroenterology

## 2017-09-27 DIAGNOSIS — Z91013 Allergy to seafood: Secondary | ICD-10-CM | POA: Diagnosis not present

## 2017-09-27 DIAGNOSIS — K210 Gastro-esophageal reflux disease with esophagitis: Secondary | ICD-10-CM | POA: Diagnosis not present

## 2017-09-27 DIAGNOSIS — K2950 Unspecified chronic gastritis without bleeding: Secondary | ICD-10-CM | POA: Insufficient documentation

## 2017-09-27 DIAGNOSIS — K5080 Crohn's disease of both small and large intestine without complications: Secondary | ICD-10-CM | POA: Insufficient documentation

## 2017-09-27 DIAGNOSIS — Z79899 Other long term (current) drug therapy: Secondary | ICD-10-CM | POA: Diagnosis not present

## 2017-09-27 DIAGNOSIS — L409 Psoriasis, unspecified: Secondary | ICD-10-CM | POA: Diagnosis not present

## 2017-09-27 DIAGNOSIS — Z888 Allergy status to other drugs, medicaments and biological substances status: Secondary | ICD-10-CM | POA: Insufficient documentation

## 2017-09-27 DIAGNOSIS — K2980 Duodenitis without bleeding: Secondary | ICD-10-CM | POA: Insufficient documentation

## 2017-09-27 DIAGNOSIS — Z882 Allergy status to sulfonamides status: Secondary | ICD-10-CM | POA: Diagnosis not present

## 2017-09-27 DIAGNOSIS — D802 Selective deficiency of immunoglobulin A [IgA]: Secondary | ICD-10-CM | POA: Insufficient documentation

## 2017-09-27 HISTORY — DX: Gastritis, unspecified, without bleeding: K29.70

## 2017-09-27 HISTORY — PX: ESOPHAGOGASTRODUODENOSCOPY (EGD) WITH PROPOFOL: SHX5813

## 2017-09-27 SURGERY — ESOPHAGOGASTRODUODENOSCOPY (EGD) WITH PROPOFOL
Anesthesia: General

## 2017-09-27 MED ORDER — FENTANYL CITRATE (PF) 100 MCG/2ML IJ SOLN
INTRAMUSCULAR | Status: DC | PRN
  Administered 2017-09-27 (×2): 50 ug via INTRAVENOUS

## 2017-09-27 MED ORDER — PROPOFOL 500 MG/50ML IV EMUL
INTRAVENOUS | Status: AC
  Filled 2017-09-27: qty 50

## 2017-09-27 MED ORDER — SODIUM CHLORIDE 0.9 % IV SOLN
INTRAVENOUS | Status: DC
  Administered 2017-09-27 (×2): via INTRAVENOUS

## 2017-09-27 MED ORDER — LIDOCAINE 2% (20 MG/ML) 5 ML SYRINGE
INTRAMUSCULAR | Status: DC | PRN
  Administered 2017-09-27: 40 mg via INTRAVENOUS

## 2017-09-27 MED ORDER — SODIUM CHLORIDE 0.9 % IV SOLN: INTRAVENOUS | Status: DC

## 2017-09-27 MED ORDER — FENTANYL CITRATE (PF) 100 MCG/2ML IJ SOLN
INTRAMUSCULAR | Status: AC
  Filled 2017-09-27: qty 2

## 2017-09-27 MED ORDER — PROPOFOL 500 MG/50ML IV EMUL
INTRAVENOUS | Status: DC | PRN
  Administered 2017-09-27: 140 ug/kg/min via INTRAVENOUS

## 2017-09-27 MED ORDER — PHENYLEPHRINE HCL 10 MG/ML IJ SOLN
INTRAMUSCULAR | Status: DC | PRN
  Administered 2017-09-27: 100 ug via INTRAVENOUS

## 2017-09-27 MED ORDER — PROPOFOL 10 MG/ML IV BOLUS
INTRAVENOUS | Status: DC | PRN
  Administered 2017-09-27: 100 mg via INTRAVENOUS

## 2017-09-27 NOTE — Anesthesia Preprocedure Evaluation (Signed)
Anesthesia Evaluation  Patient identified by MRN, date of birth, ID band Patient awake    Reviewed: Allergy & Precautions, NPO status , Patient's Chart, lab work & pertinent test results  History of Anesthesia Complications Negative for: history of anesthetic complications  Airway Mallampati: II  TM Distance: >3 FB Neck ROM: Full    Dental  (+) Edentulous Upper, Edentulous Lower   Pulmonary neg pulmonary ROS, neg sleep apnea, neg COPD,    breath sounds clear to auscultation- rhonchi (-) wheezing      Cardiovascular Exercise Tolerance: Good (-) hypertension(-) CAD and (-) Past MI  Rhythm:Regular Rate:Normal - Systolic murmurs and - Diastolic murmurs    Neuro/Psych negative neurological ROS  negative psych ROS   GI/Hepatic Neg liver ROS, GERD  ,Celiac disease    Endo/Other  negative endocrine ROSneg diabetes  Renal/GU negative Renal ROS     Musculoskeletal negative musculoskeletal ROS (+)   Abdominal (+) - obese,   Peds  Hematology  (+) anemia ,   Anesthesia Other Findings Past Medical History: No date: Allergic state No date: Anemia     Comment:  IRON DEFICIENCY ANEMIA No date: Crohn's disease ([place]) No date: Eczema No date: Gastritis No date: GERD (gastroesophageal reflux disease) No date: Hemorrhoids No date: Psoriasis   Reproductive/Obstetrics                             Anesthesia Physical Anesthesia Plan  ASA: II  Anesthesia Plan: General   Post-op Pain Management:    Induction: Intravenous  PONV Risk Score and Plan: 1 and Propofol infusion  Airway Management Planned: Natural Airway  Additional Equipment:   Intra-op Plan:   Post-operative Plan:   Informed Consent: I have reviewed the patients History and Physical, chart, labs and discussed the procedure including the risks, benefits and alternatives for the proposed anesthesia with the patient or authorized  representative who has indicated his/her understanding and acceptance.   Dental advisory given  Plan Discussed with: CRNA and Anesthesiologist  Anesthesia Plan Comments:         Anesthesia Quick Evaluation

## 2017-09-27 NOTE — Anesthesia Post-op Follow-up Note (Signed)
Anesthesia QCDR form completed.        

## 2017-09-27 NOTE — H&P (Signed)
Outpatient short stay form Pre-procedure 09/27/2017 9:50 AM Lollie Sails MD  Primary Physician: Dr. Maryland Pink, Dr. Nolon Stalls  Reason for visit:  EGD  History of present illness:  Patient is a 26 year old male presenting today for an EGD. He has personal history of Crohn's disease affecting the small bowel stomach and colon. He is presenting today for a recheck following reinitiation of treatment. He states he has been doing well. There is no problems with nausea vomiting or abdominal pain. His tight has been good. His bowel habits have been regular. There is no blood in stool.    Current Facility-Administered Medications:  .  0.9 %  sodium chloride infusion, , Intravenous, Continuous, Lollie Sails, MD, Last Rate: 20 mL/hr at 09/27/17 0924 .  0.9 %  sodium chloride infusion, , Intravenous, Continuous, Lollie Sails, MD  Prescriptions Prior to Admission  Medication Sig Dispense Refill Last Dose  . mesalamine (LIALDA) 1.2 G EC tablet Take 1.2 g by mouth daily.    09/26/2017 at Unknown time  . omeprazole (PRILOSEC) 40 MG capsule Take 40 mg by mouth daily as needed.    Past Month at Unknown time  . budesonide (ENTOCORT EC) 3 MG 24 hr capsule Take 3 mg by mouth at bedtime.   Completed Course at Unknown time  . Certolizumab Pegol 2 X 200 MG/ML KIT Inject into the skin every 14 (fourteen) days.    09/20/2017  . ciprofloxacin (CIPRO) 500 MG tablet Take 1 tablet (500 mg total) by mouth 2 (two) times daily. (Patient not taking: Reported on 09/27/2017) 10 tablet 0 Completed Course at Unknown time  . fluticasone (FLONASE) 50 MCG/ACT nasal spray Place 1 spray into the nose daily.   Not Taking at Unknown time  . Iron Dextran (INFED IJ) Inject 100 mg into the vein every 3 (three) months.   Not Taking at Unknown time  . metroNIDAZOLE (FLAGYL) 250 MG tablet Take 1 tablet (250 mg total) by mouth every 8 (eight) hours. (Patient not taking: Reported on 09/27/2017) 15 tablet 0 Not Taking at  Unknown time  . nystatin (MYCOSTATIN) 100000 UNIT/ML suspension Take 5 mLs (500,000 Units total) by mouth 4 (four) times daily. Swiss and spit (Patient not taking: Reported on 09/27/2017) 60 mL 0 Completed Course at Unknown time  . predniSONE (DELTASONE) 5 MG tablet 8 tabs po day 1; 7 tabs po day2; 6 tabs po day3; 5 tabs po day4; 4 tabs po day5; 3 tabs po day6; 2 tabs po day7; 1 tab po day8 (Patient not taking: Reported on 09/27/2017) 36 tablet 0 Completed Course at Unknown time     Allergies  Allergen Reactions  . Sulfamethoxazole-Trimethoprim Anaphylaxis  . Fish Allergy Swelling    hives  . Rifampin Other (See Comments)    Flu like illness     Past Medical History:  Diagnosis Date  . Allergic state   . Anemia    IRON DEFICIENCY ANEMIA  . Crohn's disease ([place])   . Eczema   . Gastritis   . GERD (gastroesophageal reflux disease)   . Hemorrhoids   . Psoriasis     Review of systems:      Physical Exam    Heart and lungs: Regular rate and rhythm without rub or gallop, lungs are bilaterally clear.    HEENT: Normocephalic atraumatic eyes are anicteric    Other:     Pertinant exam for procedure: Soft nontender nondistended bowel sounds positive normoactive.    Planned proceedures: EGD  and indicated procedures. I have discussed the risks benefits and complications of procedures to include not limited to bleeding, infection, perforation and the risk of sedation and the patient wishes to proceed.    Lollie Sails, MD Gastroenterology 09/27/2017  9:50 AM

## 2017-09-27 NOTE — Op Note (Signed)
[facility] Gastroenterology Patient Name: Daniel Bird Procedure Date: 09/27/2017 9:48 AM MRN: [id number] Account #: [id number] Date of Birth: 10-04-1991 Admit Type: Outpatient Age: 26 Room: [facility] ENDO ROOM 1 Gender: Male Note Status: Finalized Procedure:            Upper GI endoscopy Indications:          Suspected celiac disease, Selective IGA deficiency,                        Crohn's disease Providers:            Lollie Sails, MD Referring MD:         Irven Easterly. Kary Kos, MD (Referring MD) Medicines:            Monitored Anesthesia Care Complications:        No immediate complications. Procedure:            Pre-Anesthesia Assessment:                       - ASA Grade Assessment: III - A patient with severe                        systemic disease.                       After obtaining informed consent, the endoscope was                        passed under direct vision. Throughout the procedure,                        the patient's blood pressure, pulse, and oxygen                        saturations were monitored continuously. The Endoscope                        was introduced through the mouth, and advanced to the                        third part of duodenum. The upper GI endoscopy was                        accomplished without difficulty. The patient tolerated                        the procedure well. Findings:      The examined esophagus was normal.      The Z-line was regular. Biopsies were taken with a cold forceps for       histology.      Diffuse and patchy mild inflammation characterized by congestion (edema)       and erythema was found in the gastric body and in the gastric antrum.       Biopsies were taken with a cold forceps for histology.      The cardia and gastric fundus were normal on retroflexion.      Multiple localized, 2 to 3 mm erosions without bleeding were found in       the second portion of the duodenum, mild mucosal  friability. The third       portion of  the duodenum appeared much more normal in appearance without       erosions, and normal appearance of the mucosa otherwise. No scalloping       noted. Biopsies were also taken of the third portion where the mucosa       appeared more normal in appearance, placed into a separate jar. Biopsies       were taken with a cold forceps for histology. Impression:           - Normal esophagus.                       - Z-line regular. Biopsied.                       - Gastritis. Biopsied.                       - Erosive duodenopathy without bleeding with overall                        gross improvement noted. Biopsied. Recommendation:       - Await pathology results.                       - Continue present medications.                       - Return to GI clinic in 6 weeks. 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 All rights reserved. The codes documented in this report are preliminary and upon coder review may  be revised to meet current compliance requirements. Lollie Sails, MD 09/27/2017 10:23:46 AM This report has been signed electronically. Number of Addenda: 0 Note Initiated On: 09/27/2017 9:48 AM      [facility]

## 2017-09-27 NOTE — Anesthesia Postprocedure Evaluation (Signed)
Anesthesia Post Note  Patient: Jorrell Kuster  Procedure(s) Performed: ESOPHAGOGASTRODUODENOSCOPY (EGD) WITH PROPOFOL (N/A )  Patient location during evaluation: Endoscopy Anesthesia Type: General Level of consciousness: awake and alert and oriented Pain management: pain level controlled Vital Signs Assessment: post-procedure vital signs reviewed and stable Respiratory status: spontaneous breathing, nonlabored ventilation and respiratory function stable Cardiovascular status: blood pressure returned to baseline and stable Postop Assessment: no signs of nausea or vomiting Anesthetic complications: no     Last Vitals:  Vitals:   09/27/17 1041 09/27/17 1051  BP: 111/81 120/82  Pulse: 81 87  Resp: 15 17  Temp:    SpO2: 100% 100%    Last Pain:  Vitals:   09/27/17 1021  TempSrc: Tympanic                 Lorrie Strauch

## 2017-09-27 NOTE — Transfer of Care (Signed)
Immediate Anesthesia Transfer of Care Note  Patient: Daniel Bird  Procedure(s) Performed: ESOPHAGOGASTRODUODENOSCOPY (EGD) WITH PROPOFOL (N/A )  Patient Location: PACU and Endoscopy Unit  Anesthesia Type:General  Level of Consciousness: sedated  Airway & Oxygen Therapy: Patient Spontanous Breathing and Patient connected to nasal cannula oxygen  Post-op Assessment: Report given to RN and Post -op Vital signs reviewed and stable  Post vital signs: Reviewed and stable  Last Vitals:  Vitals:   09/27/17 0859 09/27/17 1021  BP: 109/72 (!) 89/43  Pulse: 85 80  Resp: 18 (!) 8  Temp: (!) 36.1 C (!) 35.7 C  SpO2: 100% 100%    Last Pain:  Vitals:   09/27/17 1021  TempSrc: Tympanic         Complications: No apparent anesthesia complications

## 2017-09-28 ENCOUNTER — Encounter: Payer: Self-pay | Admitting: Gastroenterology

## 2017-09-28 LAB — SURGICAL PATHOLOGY

## 2017-10-12 ENCOUNTER — Ambulatory Visit: Payer: BLUE CROSS/BLUE SHIELD | Admitting: Hematology and Oncology

## 2017-10-12 ENCOUNTER — Ambulatory Visit: Payer: BLUE CROSS/BLUE SHIELD

## 2017-10-15 ENCOUNTER — Inpatient Hospital Stay: Payer: BLUE CROSS/BLUE SHIELD | Attending: Hematology and Oncology

## 2017-10-15 ENCOUNTER — Other Ambulatory Visit: Payer: Self-pay | Admitting: Hematology and Oncology

## 2017-10-15 DIAGNOSIS — K219 Gastro-esophageal reflux disease without esophagitis: Secondary | ICD-10-CM | POA: Insufficient documentation

## 2017-10-15 DIAGNOSIS — K5090 Crohn's disease, unspecified, without complications: Secondary | ICD-10-CM | POA: Insufficient documentation

## 2017-10-15 DIAGNOSIS — D508 Other iron deficiency anemias: Secondary | ICD-10-CM | POA: Insufficient documentation

## 2017-10-15 DIAGNOSIS — D801 Nonfamilial hypogammaglobulinemia: Secondary | ICD-10-CM

## 2017-10-15 DIAGNOSIS — D500 Iron deficiency anemia secondary to blood loss (chronic): Secondary | ICD-10-CM

## 2017-10-15 LAB — FERRITIN: Ferritin: 12 ng/mL — ABNORMAL LOW (ref 24–336)

## 2017-10-15 LAB — CBC WITH DIFFERENTIAL/PLATELET
Basophils Absolute: 0 10*3/uL (ref 0–0.1)
Basophils Relative: 1 %
Eosinophils Absolute: 0.3 10*3/uL (ref 0–0.7)
Eosinophils Relative: 3 %
HCT: 48.6 % (ref 40.0–52.0)
Hemoglobin: 15.4 g/dL (ref 13.0–18.0)
Lymphocytes Relative: 21 %
Lymphs Abs: 1.8 10*3/uL (ref 1.0–3.6)
MCH: 25.8 pg — ABNORMAL LOW (ref 26.0–34.0)
MCHC: 31.7 g/dL — ABNORMAL LOW (ref 32.0–36.0)
MCV: 81.3 fL (ref 80.0–100.0)
Monocytes Absolute: 0.6 10*3/uL (ref 0.2–1.0)
Monocytes Relative: 7 %
Neutro Abs: 6.1 10*3/uL (ref 1.4–6.5)
Neutrophils Relative %: 68 %
Platelets: 201 10*3/uL (ref 150–440)
RBC: 5.98 MIL/uL — ABNORMAL HIGH (ref 4.40–5.90)
RDW: 14.9 % — ABNORMAL HIGH (ref 11.5–14.5)
WBC: 8.9 10*3/uL (ref 3.8–10.6)

## 2018-01-10 ENCOUNTER — Inpatient Hospital Stay: Payer: BLUE CROSS/BLUE SHIELD | Attending: Hematology and Oncology

## 2018-01-10 DIAGNOSIS — D509 Iron deficiency anemia, unspecified: Secondary | ICD-10-CM | POA: Diagnosis not present

## 2018-01-10 DIAGNOSIS — D500 Iron deficiency anemia secondary to blood loss (chronic): Secondary | ICD-10-CM

## 2018-01-10 DIAGNOSIS — D801 Nonfamilial hypogammaglobulinemia: Secondary | ICD-10-CM

## 2018-01-10 LAB — CBC WITH DIFFERENTIAL/PLATELET
Basophils Absolute: 0.1 10*3/uL (ref 0–0.1)
Basophils Relative: 1 %
Eosinophils Absolute: 0.3 10*3/uL (ref 0–0.7)
Eosinophils Relative: 3 %
HCT: 47.8 % (ref 40.0–52.0)
Hemoglobin: 15 g/dL (ref 13.0–18.0)
Lymphocytes Relative: 19 %
Lymphs Abs: 1.9 10*3/uL (ref 1.0–3.6)
MCH: 25.6 pg — ABNORMAL LOW (ref 26.0–34.0)
MCHC: 31.4 g/dL — ABNORMAL LOW (ref 32.0–36.0)
MCV: 81.6 fL (ref 80.0–100.0)
Monocytes Absolute: 0.7 10*3/uL (ref 0.2–1.0)
Monocytes Relative: 7 %
Neutro Abs: 6.8 10*3/uL — ABNORMAL HIGH (ref 1.4–6.5)
Neutrophils Relative %: 70 %
Platelets: 212 10*3/uL (ref 150–440)
RBC: 5.86 MIL/uL (ref 4.40–5.90)
RDW: 14.7 % — ABNORMAL HIGH (ref 11.5–14.5)
WBC: 9.8 10*3/uL (ref 3.8–10.6)

## 2018-01-10 LAB — FERRITIN: Ferritin: 10 ng/mL — ABNORMAL LOW (ref 24–336)

## 2018-01-11 ENCOUNTER — Encounter: Payer: Self-pay | Admitting: Hematology and Oncology

## 2018-01-11 ENCOUNTER — Inpatient Hospital Stay (HOSPITAL_BASED_OUTPATIENT_CLINIC_OR_DEPARTMENT_OTHER): Payer: BLUE CROSS/BLUE SHIELD | Admitting: Hematology and Oncology

## 2018-01-11 ENCOUNTER — Inpatient Hospital Stay: Payer: BLUE CROSS/BLUE SHIELD

## 2018-01-11 VITALS — BP 118/82 | HR 83 | Temp 97.4°F | Resp 18 | Wt 118.0 lb

## 2018-01-11 DIAGNOSIS — D500 Iron deficiency anemia secondary to blood loss (chronic): Secondary | ICD-10-CM

## 2018-01-11 DIAGNOSIS — D509 Iron deficiency anemia, unspecified: Secondary | ICD-10-CM | POA: Diagnosis not present

## 2018-01-11 MED ORDER — SODIUM CHLORIDE 0.9 % IV SOLN
510.0000 mg | Freq: Once | INTRAVENOUS | Status: AC
  Administered 2018-01-11: 510 mg via INTRAVENOUS
  Filled 2018-01-11: qty 17

## 2018-01-11 MED ORDER — SODIUM CHLORIDE 0.9 % IV SOLN
Freq: Once | INTRAVENOUS | Status: AC
  Administered 2018-01-11: 15:00:00 via INTRAVENOUS
  Filled 2018-01-11: qty 1000

## 2018-01-11 NOTE — Progress Notes (Signed)
Patient offers no complaints today. 

## 2018-01-11 NOTE — Progress Notes (Signed)
[facility] day:  01/11/2018    Chief Complaint: Daniel Bird is a 27 y.o. male with Crohn's disease and recurrent iron deficiency anemia who is seen for 6 month assessment.  HPI: The patient  was last seen in the medical oncology clinic on 07/12/2017.  At that time, he was doing well.  Exam was stable.  He was going on a gluten-free diet.  Hematocrit was 46.7.  MCV was 80.1 (normal).  Ferritin was 28.  During the interim, he has done well.  Energy level is "alright".  He is off the gluten free diet.  He does not have celiac disease.  Bowels are normal.   Past Medical History:  Diagnosis Date  . Allergic state   . Anemia    IRON DEFICIENCY ANEMIA  . Crohn's disease ([place])   . Eczema   . Gastritis   . GERD (gastroesophageal reflux disease)   . Hemorrhoids   . Psoriasis     Past Surgical History:  Procedure Laterality Date  . APPENDECTOMY     May 2014  . COLON SURGERY     colectomy partial w/anastamosis  . COLONOSCOPY    . COLONOSCOPY WITH PROPOFOL N/A 02/05/2017   Procedure: COLONOSCOPY WITH PROPOFOL;  Surgeon: Lollie Sails, MD;  Location: [facility] ENDOSCOPY;  Service: Endoscopy;  Laterality: N/A;  . ESOPHAGOGASTRODUODENOSCOPY    . ESOPHAGOGASTRODUODENOSCOPY N/A 02/05/2017   Procedure: ESOPHAGOGASTRODUODENOSCOPY (EGD);  Surgeon: Lollie Sails, MD;  Location: [facility] ENDOSCOPY;  Service: Endoscopy;  Laterality: N/A;  . ESOPHAGOGASTRODUODENOSCOPY (EGD) WITH PROPOFOL N/A 09/27/2017   Procedure: ESOPHAGOGASTRODUODENOSCOPY (EGD) WITH PROPOFOL;  Surgeon: Lollie Sails, MD;  Location: [facility] ENDOSCOPY;  Service: Endoscopy;  Laterality: N/A;   History reviewed. No pertinent family history.  He is an only child.  Social History:  reports that  has never smoked. he has never used smokeless tobacco. He reports that he does not drink alcohol or use drugs.  The patient is accompanied by his great aunt, Daniel Bird.  He lives with his grandmother.   He completed college.  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  Cell number is : [phone].  He is alone today.  Allergies:  Allergies  Allergen Reactions  . Sulfamethoxazole-Trimethoprim Anaphylaxis  . Fish Allergy Swelling    hives  . Rifampin Other (See Comments)    Flu like illness    Current Medications: Current Outpatient Medications  Medication Sig Dispense Refill  . budesonide (ENTOCORT EC) 3 MG 24 hr capsule Take 3 mg by mouth at bedtime.    . Certolizumab Pegol 2 X 200 MG/ML KIT Inject into the skin every 14 (fourteen) days.     . fluticasone (FLONASE) 50 MCG/ACT nasal spray Place 1 spray into the nose daily.    . Iron Dextran (INFED IJ) Inject 100 mg into the vein every 3 (three) months.    . mesalamine (LIALDA) 1.2 G EC tablet Take 1.2 g by mouth daily.     Marland Kitchen omeprazole (PRILOSEC) 40 MG capsule Take 40 mg by mouth daily as needed.     . metroNIDAZOLE (FLAGYL) 250 MG tablet Take 1 tablet (250 mg total) by mouth every 8 (eight) hours. (Patient not taking: Reported on 01/11/2018) 15 tablet 0   No current facility-administered medications for this visit.     Review of Systems:  GENERAL:  Feels "fine".  No fevers or sweats.  Weight up 3 pounds.  PERFORMANCE STATUS (  ECOG):  1 HEENT:  Teeth affected.  No visual changes, runny nose, sore throat, mouth sores or tenderness. Lungs: No shortness of breath or cough.  No hemoptysis. Cardiac:  No chest pain, palpitations, orthopnea, or PND. GI:  Eating well.  Normal formed daily bowel movements.  No nausea, vomiting, diarrhea, constipation, melena or hematochezia. GU:  No urgency, frequency, dysuria, or hematuria. Musculoskeletal:  No back pain.  No joint pain.  No muscle tenderness. Extremities:  No pain or swelling. Skin:  No rashes or skin changes. Neuro:  No headache, numbness or weakness, balance or coordination issues. Endocrine:  No diabetes, thyroid issues, hot flashes  or night sweats. Psych:  No mood changes, depression or anxiety. Pain:  No focal pain. Review of systems:  All other systems reviewed and found to be negative.  Physical Exam: Blood pressure 118/82, pulse 83, temperature (!) 97.4 F (36.3 C), temperature source Tympanic, resp. rate 18, weight 118 lb (53.5 kg), SpO2 99 %. GENERAL:  Thin gentleman sitting comfortably in the exam room in no acute distress. MENTAL STATUS:  Alert and oriented to person, place and time. HEAD:  Dark thick curly hair.  Normocephalic, atraumatic, face symmetric, no Cushingoid features. EYES:  Glasses.  Brown eyes.  Pupils equal round and reactive to light and accomodation.  No conjunctivitis or scleral icterus. ENT:  No oral lesions.  Dentures.  Tongue normal. Mucous membranes moist.  RESPIRATORY:  Clear to auscultation without rales, wheezes or rhonchi. CARDIOVASCULAR:  Regular rate and rhythm without murmur, rub or gallop. ABDOMEN:  Soft, non-tender, with active bowel sounds, and no hepatosplenomegaly.  No masses. SKIN:  No rashes, ulcers or lesions. EXTREMITIES: No edema, no skin discoloration or tenderness.  No palpable cords. LYMPH NODES: No palpable cervical, supraclavicular, axillary or inguinal adenopathy  NEUROLOGICAL: Unremarkable. PSYCH:  Appropriate.   Appointment on 01/10/2018  Component Date Value Ref Range Status  . Ferritin 01/10/2018 10* 24 - 336 ng/mL Final   Performed at [facility], [address] [place], [place] [address]  . WBC 01/10/2018 9.8  3.8 - 10.6 K/uL Final  . RBC 01/10/2018 5.86  4.40 - 5.90 MIL/uL Final  . Hemoglobin 01/10/2018 15.0  13.0 - 18.0 g/dL Final  . HCT 01/10/2018 47.8  40.0 - 52.0 % Final  . MCV 01/10/2018 81.6  80.0 - 100.0 fL Final  . MCH 01/10/2018 25.6* 26.0 - 34.0 pg Final  . MCHC 01/10/2018 31.4* 32.0 - 36.0 g/dL Final  . RDW 01/10/2018 14.7* 11.5 - 14.5 % Final  . Platelets 01/10/2018 212  150 - 440 K/uL Final  . Neutrophils Relative %  01/10/2018 70  % Final  . Neutro Abs 01/10/2018 6.8* 1.4 - 6.5 K/uL Final  . Lymphocytes Relative 01/10/2018 19  % Final  . Lymphs Abs 01/10/2018 1.9  1.0 - 3.6 K/uL Final  . Monocytes Relative 01/10/2018 7  % Final  . Monocytes Absolute 01/10/2018 0.7  0.2 - 1.0 K/uL Final  . Eosinophils Relative 01/10/2018 3  % Final  . Eosinophils Absolute 01/10/2018 0.3  0 - 0.7 K/uL Final  . Basophils Relative 01/10/2018 1  % Final  . Basophils Absolute 01/10/2018 0.1  0 - 0.1 K/uL Final   Performed at [facility], [address] [place], [place] [address]    Assessment:  Sky Borboa is a 27 y.o. male with Crohn's disease and recurrent iron deficiency anemia.  He received IV iron 3-4 times in 2015 while living in [place].  Colonoscopy in 2015 was negative per patient report.  He may have celiac disease.  Transglutaminase IgG was 10 (> 9) with an IgA of <5 (90 - 386).   EGD on 02/05/2017 revealed a normal esophagus, gastritis and multiple mucosal papules (nodules) in the stomach. There were multiple nonbleeding duodenal ulcers with white base.  Duodenal biopsy revealed moderate chronic active duodenitis with villous blunting and ulceration. There was no granulomatous or infectious agents.  The stomach revealed marked chronic active gastritis.   Colonoscopy on 02/05/2017 revealed scattered/small areas of minimal erythema. The ileocolonic anastomosis revealed mild inflammation. The ileal opening was stenotic with inability to advance the scope into the neoterminal ileum. Biopsies of the ileal side of the anastomosis revealed marked chronic active ileitis with erosion and 2 tiny poorly formed granulomas. Additional biopsies were negative for colitis in the ascending, transverse and right colon.  All samples were negative for dysplasia and malignancy.  He was admitted to [facility] from 04/05/2017 - 04/07/2017 with generalized abdominal pain, and nausea and vomiting.  Abdomen and pelvic CT scan on  04/05/2017 revealed active Crohn ileitis within an abnormal long small bowel segment in the anterior pelvis, with associated functional mild small bowel obstruction.  There was active Crohn ileitis in the neo-terminal ileum and active Crohn colitis in the mid sigmoid colon.  There was no fistula, abscess or free air detected.  There was trace ascites.  He has received Feraheme on several occasions (08/27/2015, 11/12/2015, 02/04/2016, and 03/20/2017).  Ferritin has been followed: 10 on 05/18/2015, 11 on 08/17/2015, 13 on 09/28/2015, 7 on 11/11/2015, 11 on 02/03/2016, 43 on 04/27/2016, 33 on 07/25/2016, 24 on 10/19/2016, 19 on 03/01/2017, 28 on 07/12/2017, 12 on 10/15/2017, and 10 on 01/10/2018.  Symptomatically, he is doing well.  Exam is stable.  Hematocrit is 47.8.  MCV is 81.6 (normal).  Ferritin has drifted down to 10.  Plan: 1.  Review labs from yesterday.  Hematocrit and MCV are normal.  Ferritin has dropped to 10.  Discuss IV iron.  Patient wishes to proceed. 2.  Feraheme today. 3.  RTC in 3 months for labs (CBC with diff, ferritin). 4.  RTC in 6 months for MD assessment and labs (CBC with diff, ferritin-day before), and +/- Feraheme.   Lequita Asal, MD  01/11/2018, 5:35 PM

## 2018-04-11 ENCOUNTER — Inpatient Hospital Stay: Payer: BLUE CROSS/BLUE SHIELD | Attending: Hematology and Oncology

## 2018-04-11 DIAGNOSIS — D509 Iron deficiency anemia, unspecified: Secondary | ICD-10-CM | POA: Insufficient documentation

## 2018-04-11 DIAGNOSIS — D500 Iron deficiency anemia secondary to blood loss (chronic): Secondary | ICD-10-CM

## 2018-04-11 LAB — CBC WITH DIFFERENTIAL/PLATELET
Basophils Absolute: 0.1 10*3/uL (ref 0–0.1)
Basophils Relative: 1 %
Eosinophils Absolute: 0.2 10*3/uL (ref 0–0.7)
Eosinophils Relative: 3 %
HCT: 46.5 % (ref 40.0–52.0)
Hemoglobin: 15.1 g/dL (ref 13.0–18.0)
Lymphocytes Relative: 21 %
Lymphs Abs: 1.6 10*3/uL (ref 1.0–3.6)
MCH: 26.5 pg (ref 26.0–34.0)
MCHC: 32.6 g/dL (ref 32.0–36.0)
MCV: 81.3 fL (ref 80.0–100.0)
Monocytes Absolute: 0.8 10*3/uL (ref 0.2–1.0)
Monocytes Relative: 10 %
Neutro Abs: 5 10*3/uL (ref 1.4–6.5)
Neutrophils Relative %: 65 %
Platelets: 201 10*3/uL (ref 150–440)
RBC: 5.71 MIL/uL (ref 4.40–5.90)
RDW: 14.7 % — ABNORMAL HIGH (ref 11.5–14.5)
WBC: 7.7 10*3/uL (ref 3.8–10.6)

## 2018-04-11 LAB — FERRITIN: Ferritin: 32 ng/mL (ref 24–336)

## 2018-07-02 ENCOUNTER — Other Ambulatory Visit: Payer: Self-pay | Admitting: Hematology and Oncology

## 2018-07-11 ENCOUNTER — Inpatient Hospital Stay: Payer: BLUE CROSS/BLUE SHIELD | Attending: Hematology and Oncology

## 2018-07-11 ENCOUNTER — Other Ambulatory Visit: Payer: Self-pay

## 2018-07-11 DIAGNOSIS — Z79899 Other long term (current) drug therapy: Secondary | ICD-10-CM | POA: Insufficient documentation

## 2018-07-11 DIAGNOSIS — D508 Other iron deficiency anemias: Secondary | ICD-10-CM | POA: Insufficient documentation

## 2018-07-11 DIAGNOSIS — K5010 Crohn's disease of large intestine without complications: Secondary | ICD-10-CM | POA: Diagnosis not present

## 2018-07-11 DIAGNOSIS — D500 Iron deficiency anemia secondary to blood loss (chronic): Secondary | ICD-10-CM

## 2018-07-11 LAB — CBC WITH DIFFERENTIAL/PLATELET
Basophils Absolute: 0.1 10*3/uL (ref 0–0.1)
Basophils Relative: 1 %
Eosinophils Absolute: 0.2 10*3/uL (ref 0–0.7)
Eosinophils Relative: 2 %
HCT: 47.7 % (ref 40.0–52.0)
Hemoglobin: 15.2 g/dL (ref 13.0–18.0)
Lymphocytes Relative: 24 %
Lymphs Abs: 2 10*3/uL (ref 1.0–3.6)
MCH: 26 pg (ref 26.0–34.0)
MCHC: 31.9 g/dL — ABNORMAL LOW (ref 32.0–36.0)
MCV: 81.5 fL (ref 80.0–100.0)
Monocytes Absolute: 0.7 10*3/uL (ref 0.2–1.0)
Monocytes Relative: 8 %
Neutro Abs: 5.5 10*3/uL (ref 1.4–6.5)
Neutrophils Relative %: 65 %
Platelets: 224 10*3/uL (ref 150–440)
RBC: 5.85 MIL/uL (ref 4.40–5.90)
RDW: 14.5 % (ref 11.5–14.5)
WBC: 8.4 10*3/uL (ref 3.8–10.6)

## 2018-07-11 LAB — FERRITIN: Ferritin: 25 ng/mL (ref 24–336)

## 2018-07-12 ENCOUNTER — Inpatient Hospital Stay: Payer: BLUE CROSS/BLUE SHIELD

## 2018-07-12 ENCOUNTER — Inpatient Hospital Stay (HOSPITAL_BASED_OUTPATIENT_CLINIC_OR_DEPARTMENT_OTHER): Payer: BLUE CROSS/BLUE SHIELD | Admitting: Urgent Care

## 2018-07-12 VITALS — BP 108/67 | HR 72 | Temp 97.0°F | Resp 18

## 2018-07-12 VITALS — BP 119/71 | HR 94 | Temp 96.6°F | Resp 18 | Wt 113.6 lb

## 2018-07-12 DIAGNOSIS — Z79899 Other long term (current) drug therapy: Secondary | ICD-10-CM

## 2018-07-12 DIAGNOSIS — D508 Other iron deficiency anemias: Secondary | ICD-10-CM | POA: Diagnosis not present

## 2018-07-12 DIAGNOSIS — R5383 Other fatigue: Secondary | ICD-10-CM

## 2018-07-12 DIAGNOSIS — K5010 Crohn's disease of large intestine without complications: Secondary | ICD-10-CM

## 2018-07-12 DIAGNOSIS — D500 Iron deficiency anemia secondary to blood loss (chronic): Secondary | ICD-10-CM

## 2018-07-12 MED ORDER — SODIUM CHLORIDE 0.9 % IV SOLN
510.0000 mg | Freq: Once | INTRAVENOUS | Status: AC
  Administered 2018-07-12: 510 mg via INTRAVENOUS
  Filled 2018-07-12: qty 17

## 2018-07-12 MED ORDER — SODIUM CHLORIDE 0.9 % IV SOLN
Freq: Once | INTRAVENOUS | Status: AC
  Administered 2018-07-12: 14:00:00 via INTRAVENOUS
  Filled 2018-07-12: qty 1000

## 2018-07-12 NOTE — Progress Notes (Signed)
Patient offers no complaints today. 

## 2018-07-12 NOTE — Progress Notes (Signed)
[facility] day:  01/11/2018    Chief Complaint: Daniel Bird is a 27 y.o. male with Crohn's disease and recurrent iron deficiency anemia who is seen for 6 month assessment.  HPI: The patient  was last seen in the hematology clinic on 01/11/2018.  At that time, patient was doing well.  He denied any acute complaints.  His energy level was noted to be "all right".  Patient was off of his gluten-free diet, as he was noted not to have celiac disease.  Bowels have returned to normal.  Exam was stable.  Hematocrit 47.8.  MCV 81.6.  Ferritin had drifted down to 10.  He received Feraheme 510 mg IV.  CBC on 04/11/2018 revealed a WBC of 7700 with an [employer] of 5000.  Hemoglobin 15.1, hematocrit 46.5, MCV 81.3, and platelets 201,000.  Ferritin was stable at 32. CBC on 07/11/2018 revealed a WBC of 8400 with an [place] of 5500.  Hemoglobin 15.2, hematocrit 47.7, MCV 81.5, and platelets 224,000.  Ferritin had dropped to 25.  Patient being followed by gastroenterology.  He was seen in follow-up consult on 06/04/2018 by Stephens November, NP.  Notes reviewed.  Patient being followed for stable Crohn's disease.  Patient remains on Cimzia.  Endoscopies are due in 2021.  Patient to follow-up with gastroenterology in 6 months.  In the interim, patient has been doing "normal". Patient notes that he has become more fatigued over the course of the last month or so. Patient attributes his increased fatigue to the heat. Patient denies bleeding; no hematochezia, melena, or gross hematuria. He denies ice pica and restless leg symptoms. He denies any significant acute concerns today.   Patient denies that he has experienced any B symptoms. He denies any interval infections. Patient advises that he maintains an adequate appetite. He is eating well. Weight today is 113 lb 9 oz (51.5 kg), which compared to his last visit to the clinic, represents a  5 pound decrease.   Patient denies pain in  the clinic today.  Past Medical History:  Diagnosis Date  . Allergic state   . Anemia    IRON DEFICIENCY ANEMIA  . Crohn's disease ([place])   . Eczema   . Gastritis   . GERD (gastroesophageal reflux disease)   . Hemorrhoids   . Psoriasis     Past Surgical History:  Procedure Laterality Date  . APPENDECTOMY     May 2014  . COLON SURGERY     colectomy partial w/anastamosis  . COLONOSCOPY    . COLONOSCOPY WITH PROPOFOL N/A 02/05/2017   Procedure: COLONOSCOPY WITH PROPOFOL;  Surgeon: Lollie Sails, MD;  Location: [facility] ENDOSCOPY;  Service: Endoscopy;  Laterality: N/A;  . ESOPHAGOGASTRODUODENOSCOPY    . ESOPHAGOGASTRODUODENOSCOPY N/A 02/05/2017   Procedure: ESOPHAGOGASTRODUODENOSCOPY (EGD);  Surgeon: Lollie Sails, MD;  Location: [facility] ENDOSCOPY;  Service: Endoscopy;  Laterality: N/A;  . ESOPHAGOGASTRODUODENOSCOPY (EGD) WITH PROPOFOL N/A 09/27/2017   Procedure: ESOPHAGOGASTRODUODENOSCOPY (EGD) WITH PROPOFOL;  Surgeon: Lollie Sails, MD;  Location: [facility] ENDOSCOPY;  Service: Endoscopy;  Laterality: N/A;   No family history on file.  He is an only child.  Social History:  reports that he has never smoked. He has never used smokeless tobacco. He reports that he does not drink alcohol or use drugs.  The patient is accompanied by his great aunt, Vaughan Basta.  He lives with his grandmother.  He completed college.  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  Cell number is : [phone].  He is alone today.  Allergies:  Allergies  Allergen Reactions  . Sulfamethoxazole-Trimethoprim Anaphylaxis  . Fish Allergy Swelling    hives  . Other Swelling    Tree nuts - also causes hives  . Rifampin Other (See Comments)    Flu like illness    Current Medications: Current Outpatient Medications  Medication Sig Dispense Refill  . budesonide (ENTOCORT EC) 3 MG 24 hr capsule Take 3 mg by mouth at bedtime.    . Certolizumab Pegol 2 X 200 MG/ML KIT  Inject into the skin every 14 (fourteen) days.     . fluticasone (FLONASE) 50 MCG/ACT nasal spray Place 1 spray into the nose daily.    . Iron Dextran (INFED IJ) Inject 100 mg into the vein every 3 (three) months.    . mesalamine (LIALDA) 1.2 G EC tablet Take 1.2 g by mouth daily.     Marland Kitchen omeprazole (PRILOSEC) 40 MG capsule Take 40 mg by mouth daily as needed.      No current facility-administered medications for this visit.     Review of Systems  Constitutional: Positive for malaise/fatigue (increasing over the last month) and weight loss (down 5 pounds). Negative for diaphoresis and fever.       "Im doing fine". Active, but increased fatigue.   HENT: Negative.   Eyes: Negative.   Respiratory: Negative for cough, hemoptysis, sputum production and shortness of breath.   Cardiovascular: Negative for chest pain, palpitations, orthopnea, leg swelling and PND.  Gastrointestinal: Negative for abdominal pain, blood in stool, constipation, diarrhea, melena, nausea and vomiting.  Genitourinary: Negative for dysuria, frequency, hematuria and urgency.  Musculoskeletal: Negative for back pain, falls, joint pain and myalgias.  Skin: Negative for itching and rash.  Neurological: Negative for dizziness, tremors, weakness and headaches.  Endo/Heme/Allergies: Does not bruise/bleed easily.  Psychiatric/Behavioral: Negative for depression, memory loss and suicidal ideas. The patient is not nervous/anxious and does not have insomnia.   All other systems reviewed and are negative.  Performance status (ECOG): 1 - Symptomatic but completely ambulatory  Vital Signs BP 119/71 (BP Location: Left Arm, Patient Position: Sitting)   Pulse 94   Temp (!) 96.6 F (35.9 C) (Tympanic)   Resp 18   Wt 113 lb 9 oz (51.5 kg)   SpO2 100%   BMI 20.12 kg/m   Physical Exam  Constitutional: He is oriented to person, place, and time and well-developed, well-nourished, and in no distress.  HENT:  Head: Normocephalic and  atraumatic.  Black hair  Eyes: Pupils are equal, round, and reactive to light. EOM are normal. No scleral icterus.  Brown eyes  Neck: Normal range of motion. Neck supple. No tracheal deviation present. No thyromegaly present.  Cardiovascular: Normal rate, regular rhythm and normal heart sounds. Exam reveals no gallop and no friction rub.  No murmur heard. Pulmonary/Chest: Effort normal and breath sounds normal. No respiratory distress. He has no wheezes. He has no rales.  Abdominal: Soft. Bowel sounds are normal. He exhibits no distension. There is no tenderness.  Musculoskeletal: Normal range of motion. He exhibits no edema or tenderness.  Lymphadenopathy:    He has no cervical adenopathy.    He has no axillary adenopathy.       Right: No inguinal and no supraclavicular adenopathy present.       Left: No inguinal and no supraclavicular adenopathy present.  Neurological: He is alert and oriented to person, place, and  time.  Skin: Skin is warm and dry. No rash noted. No erythema.  Psychiatric: Mood, affect and judgment normal.  Nursing note and vitals reviewed.   Orders Only on 07/11/2018  Component Date Value Ref Range Status  . Ferritin 07/11/2018 25  24 - 336 ng/mL Final   Performed at [facility] [address] [place], [place] [address]  . WBC 07/11/2018 8.4  3.8 - 10.6 K/uL Final  . RBC 07/11/2018 5.85  4.40 - 5.90 MIL/uL Final  . Hemoglobin 07/11/2018 15.2  13.0 - 18.0 g/dL Final  . HCT 07/11/2018 47.7  40.0 - 52.0 % Final  . MCV 07/11/2018 81.5  80.0 - 100.0 fL Final  . MCH 07/11/2018 26.0  26.0 - 34.0 pg Final  . MCHC 07/11/2018 31.9* 32.0 - 36.0 g/dL Final  . RDW 07/11/2018 14.5  11.5 - 14.5 % Final  . Platelets 07/11/2018 224  150 - 440 K/uL Final  . Neutrophils Relative % 07/11/2018 65  % Final  . Neutro Abs 07/11/2018 5.5  1.4 - 6.5 K/uL Final  . Lymphocytes Relative 07/11/2018 24  % Final  . Lymphs Abs 07/11/2018 2.0  1.0 - 3.6 K/uL Final  . Monocytes  Relative 07/11/2018 8  % Final  . Monocytes Absolute 07/11/2018 0.7  0.2 - 1.0 K/uL Final  . Eosinophils Relative 07/11/2018 2  % Final  . Eosinophils Absolute 07/11/2018 0.2  0 - 0.7 K/uL Final  . Basophils Relative 07/11/2018 1  % Final  . Basophils Absolute 07/11/2018 0.1  0 - 0.1 K/uL Final   Performed at [facility], [address] [place], [place] [address]    Assessment:  Daniel Bird is a 27 y.o. male with Crohn's disease and recurrent iron deficiency anemia.  He received IV iron 3-4 times in 2015 while living in [place].  Colonoscopy in 2015 was negative per patient report.  He may have celiac disease.  Transglutaminase IgG was 10 (> 9) with an IgA of <5 (90 - 386).   EGD on 02/05/2017 revealed a normal esophagus, gastritis and multiple mucosal papules (nodules) in the stomach. There were multiple nonbleeding duodenal ulcers with white base.  Duodenal biopsy revealed moderate chronic active duodenitis with villous blunting and ulceration. There was no granulomatous or infectious agents.  The stomach revealed marked chronic active gastritis.   Colonoscopy on 02/05/2017 revealed scattered/small areas of minimal erythema. The ileocolonic anastomosis revealed mild inflammation. The ileal opening was stenotic with inability to advance the scope into the neoterminal ileum. Biopsies of the ileal side of the anastomosis revealed marked chronic active ileitis with erosion and 2 tiny poorly formed granulomas. Additional biopsies were negative for colitis in the ascending, transverse and right colon.  All samples were negative for dysplasia and malignancy.  He was admitted to [facility] from 04/05/2017 - 04/07/2017 with generalized abdominal pain, and nausea and vomiting.  Abdomen and pelvic CT scan on 04/05/2017 revealed active Crohn ileitis within an abnormal long small bowel segment in the anterior pelvis, with associated functional mild small bowel obstruction.  There was active Crohn  ileitis in the neo-terminal ileum and active Crohn colitis in the mid sigmoid colon.  There was no fistula, abscess or free air detected.  There was trace ascites.  He has received Feraheme on several occasions (08/27/2015, 11/12/2015, 02/04/2016,  03/20/2017, and 01/11/2018).  Ferritin has been followed: 10 on 05/18/2015, 11 on 08/17/2015, 13 on 09/28/2015, 7 on 11/11/2015, 11 on 02/03/2016, 43 on 04/27/2016, 33 on  07/25/2016, 24 on 10/19/2016, 19 on 03/01/2017, 28 on 07/12/2017, 12 on 10/15/2017, 10 on 01/10/2018, 32 on 04/11/2018, and 25 on 07/11/2018.  Symptomatically, patient is becoming more fatigued. He denies bleeding; no hematochezia, melena, or gross hematuria. Eating well; weight down 5 pounds. No ice pica or restless legs.  Exam is stable.  Labs from 07/11/2018 reviewed.  Plan: 1. Review labs from 07/11/2018.  Hemoglobin and hematocrit normal.  MCV normal.  Ferritin drifting down. 2. Discuss iron stores. Ferritin low at 25.  Discussed the need for further intravenous iron replacement.  Patient notes that he has not benefited from oral iron supplementation in the past. He is becoming more fatigued, and wishes to receive iron.  Feraheme 510 mg IV today. Will plan on increasing frequency of lab monitoring to prevent patient from dropping to the point where he is symptomatic.  3. Discussed continued follow-up with gastroenterology.  Patient with a Crohn's disease diagnosis and being chronically treated with Cimzia.  Follow-up as already scheduled. 4. RTC in 6 weeks for labs (CBC with, ferritin).  5. RTC in 3 months for labs (CBC with diff, ferritin). 6. RTC in 6 months for MD assessment and labs (CBC with diff, ferritin-day before), and +/- Feraheme.  Honor Loh, NP 07/12/18, 2:08 PM

## 2018-08-23 ENCOUNTER — Inpatient Hospital Stay: Payer: BLUE CROSS/BLUE SHIELD | Attending: Hematology and Oncology

## 2018-08-23 ENCOUNTER — Other Ambulatory Visit: Payer: Self-pay

## 2018-08-23 DIAGNOSIS — K5010 Crohn's disease of large intestine without complications: Secondary | ICD-10-CM | POA: Diagnosis not present

## 2018-08-23 DIAGNOSIS — Z79899 Other long term (current) drug therapy: Secondary | ICD-10-CM | POA: Insufficient documentation

## 2018-08-23 DIAGNOSIS — D508 Other iron deficiency anemias: Secondary | ICD-10-CM | POA: Diagnosis not present

## 2018-08-23 DIAGNOSIS — D500 Iron deficiency anemia secondary to blood loss (chronic): Secondary | ICD-10-CM

## 2018-08-23 LAB — CBC WITH DIFFERENTIAL/PLATELET
BASOS ABS: 0 10*3/uL (ref 0–0.1)
BASOS PCT: 0 %
Eosinophils Absolute: 0.1 10*3/uL (ref 0–0.7)
Eosinophils Relative: 2 %
HCT: 45.6 % (ref 40.0–52.0)
Hemoglobin: 14.6 g/dL (ref 13.0–18.0)
Lymphocytes Relative: 28 %
Lymphs Abs: 2.2 10*3/uL (ref 1.0–3.6)
MCH: 26 pg (ref 26.0–34.0)
MCHC: 31.9 g/dL — ABNORMAL LOW (ref 32.0–36.0)
MCV: 81.5 fL (ref 80.0–100.0)
MONO ABS: 0.5 10*3/uL (ref 0.2–1.0)
Monocytes Relative: 6 %
Neutro Abs: 5 10*3/uL (ref 1.4–6.5)
Neutrophils Relative %: 64 %
PLATELETS: 254 10*3/uL (ref 150–440)
RBC: 5.6 MIL/uL (ref 4.40–5.90)
RDW: 14.9 % — AB (ref 11.5–14.5)
WBC: 7.9 10*3/uL (ref 3.8–10.6)

## 2018-08-23 LAB — FERRITIN: Ferritin: 71 ng/mL (ref 24–336)

## 2018-10-11 ENCOUNTER — Inpatient Hospital Stay: Payer: BLUE CROSS/BLUE SHIELD | Attending: Hematology and Oncology

## 2018-10-11 DIAGNOSIS — D508 Other iron deficiency anemias: Secondary | ICD-10-CM | POA: Insufficient documentation

## 2018-10-11 DIAGNOSIS — D500 Iron deficiency anemia secondary to blood loss (chronic): Secondary | ICD-10-CM

## 2018-10-11 LAB — CBC WITH DIFFERENTIAL/PLATELET
Abs Immature Granulocytes: 0.02 10*3/uL (ref 0.00–0.07)
BASOS ABS: 0.1 10*3/uL (ref 0.0–0.1)
Basophils Relative: 1 %
EOS ABS: 0.3 10*3/uL (ref 0.0–0.5)
Eosinophils Relative: 3 %
HEMATOCRIT: 48.1 % (ref 39.0–52.0)
Hemoglobin: 14.7 g/dL (ref 13.0–17.0)
IMMATURE GRANULOCYTES: 0 %
LYMPHS ABS: 2 10*3/uL (ref 0.7–4.0)
LYMPHS PCT: 22 %
MCH: 25.6 pg — ABNORMAL LOW (ref 26.0–34.0)
MCHC: 30.6 g/dL (ref 30.0–36.0)
MCV: 83.8 fL (ref 80.0–100.0)
Monocytes Absolute: 0.8 10*3/uL (ref 0.1–1.0)
Monocytes Relative: 9 %
NEUTROS PCT: 65 %
NRBC: 0 % (ref 0.0–0.2)
Neutro Abs: 6 10*3/uL (ref 1.7–7.7)
Platelets: 221 10*3/uL (ref 150–400)
RBC: 5.74 MIL/uL (ref 4.22–5.81)
RDW: 13.7 % (ref 11.5–15.5)
WBC: 9.2 10*3/uL (ref 4.0–10.5)

## 2018-10-11 LAB — FERRITIN: Ferritin: 50 ng/mL (ref 24–336)

## 2018-11-11 IMAGING — CT CT ABD-PELV W/ CM
2 of 4 series · 15 of 46 positions shown, 17 images · IV contrast (APPLIED)
Comparison: None.

CLINICAL DATA: Crohn disease. Generalized abdominal pain, vomiting
and diarrhea.

EXAM:
CT ABDOMEN AND PELVIS WITH CONTRAST
TECHNIQUE: Multidetector CT imaging of the abdomen and pelvis was performed
using the standard protocol following bolus administration of
intravenous contrast.
CONTRAST:  100mL [id number] IOPAMIDOL ([id number]) INJECTION 61%

[Series 2: axial st · axial · 0.68mm/px · z∈[-457,-82]mm · 12 of 86 slices shown, 14 images]
[im 7/86  soft-tissue]
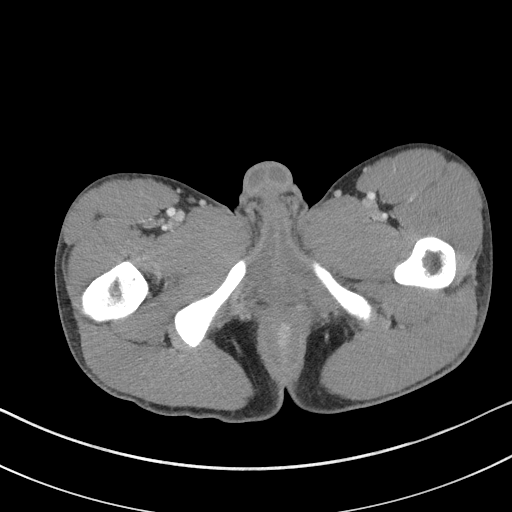
[im 7/86  bone]
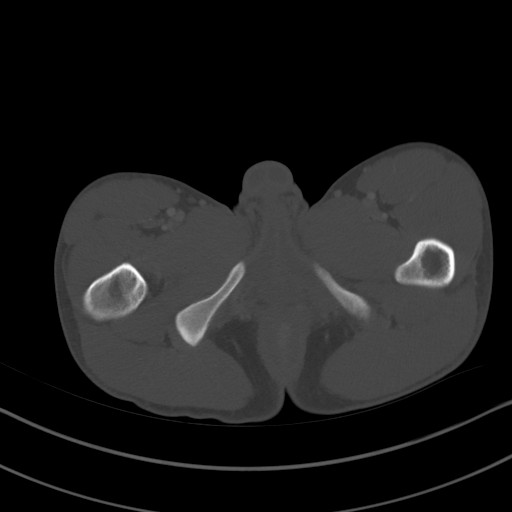
[im 14/86  soft-tissue]
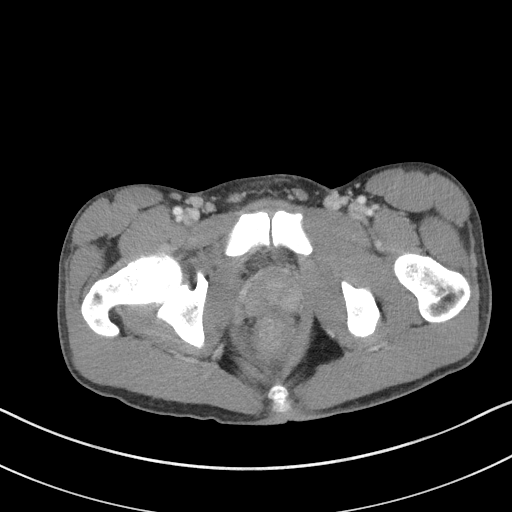
[im 21/86  soft-tissue]
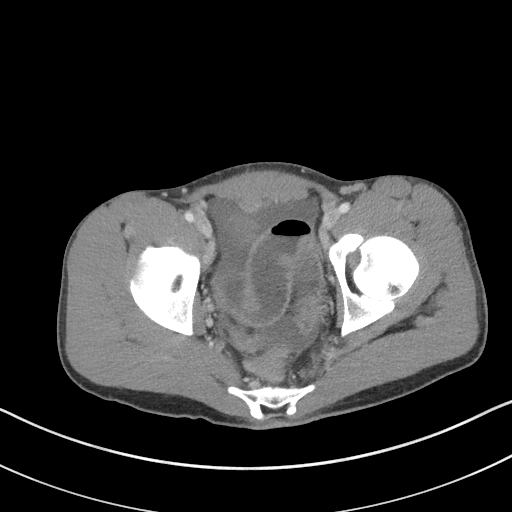
[im 28/86  soft-tissue]
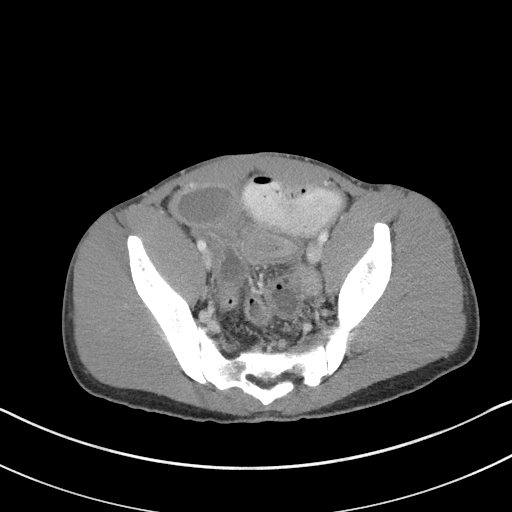
[im 35/86  soft-tissue]
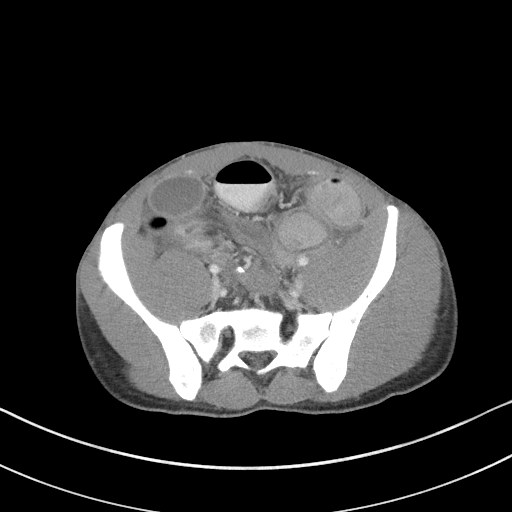
[im 41/86  soft-tissue]
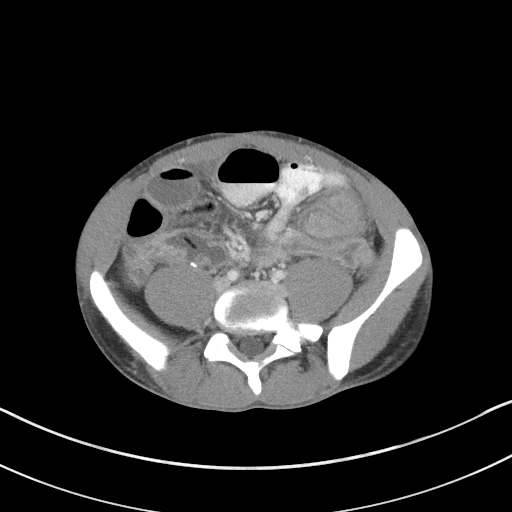
[im 48/86  soft-tissue]
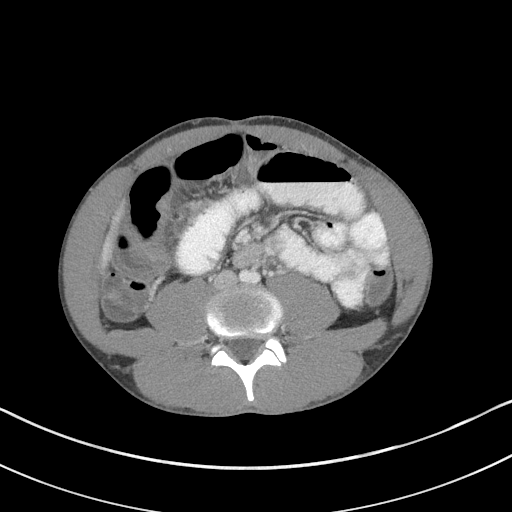
[im 55/86  soft-tissue]
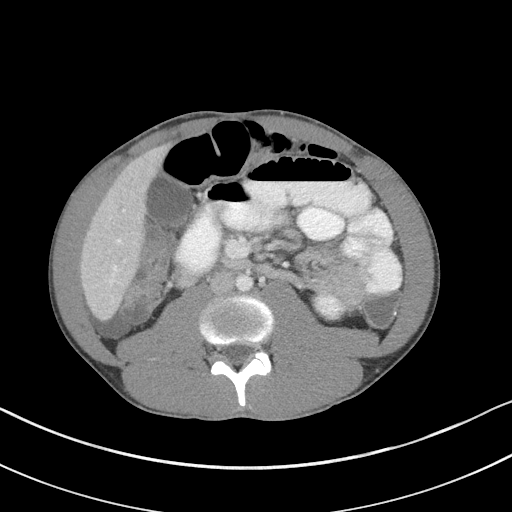
[im 62/86  soft-tissue]
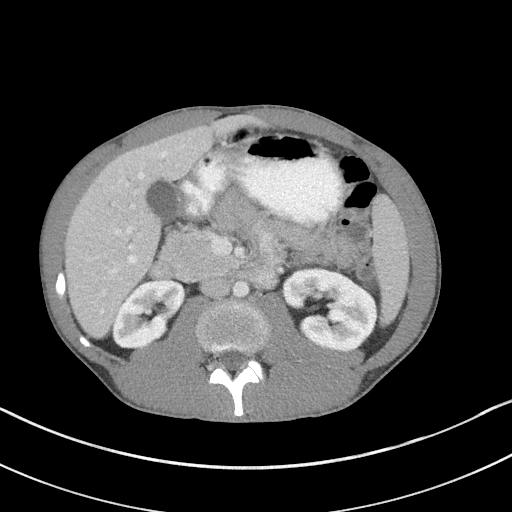
[im 62/86  bone]
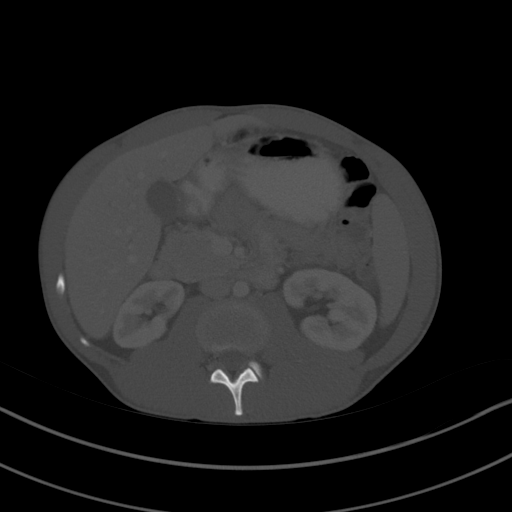
[im 69/86  soft-tissue]
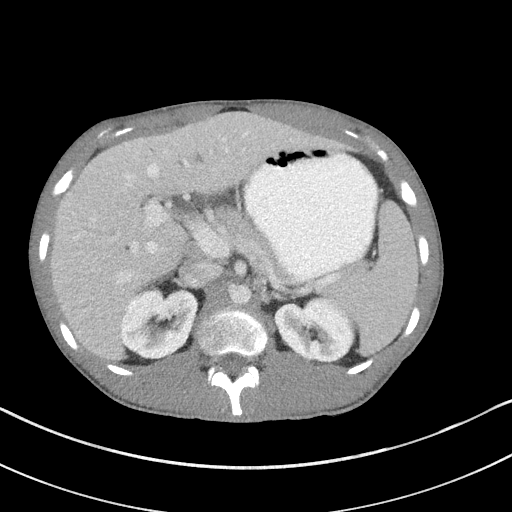
[im 75/86  soft-tissue]
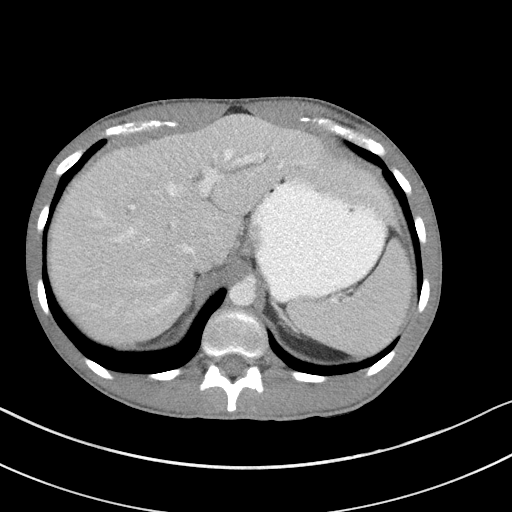
[im 82/86  soft-tissue]
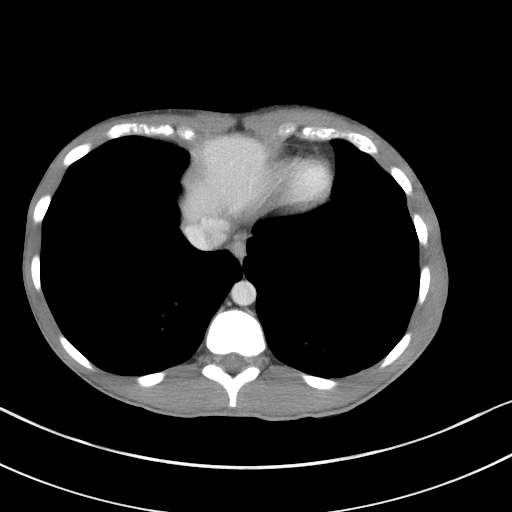

[Series 5: coronal st · coronal · 0.63mm/px · 3 of 77 slices shown]
[im 26/77  soft-tissue]
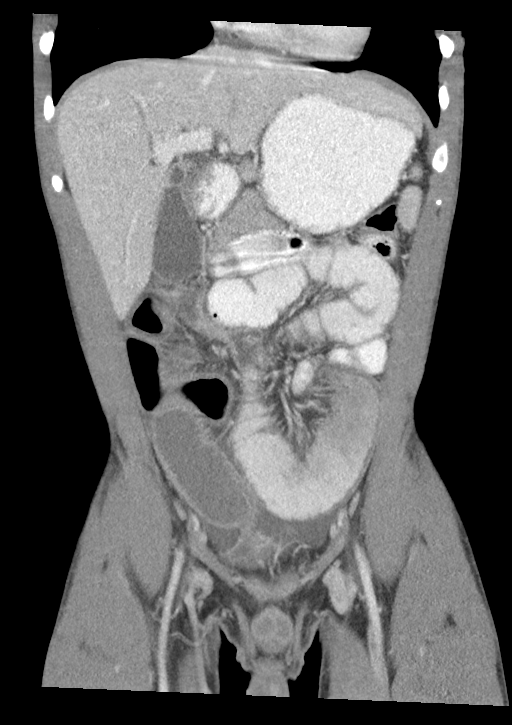
[im 34/77  soft-tissue]
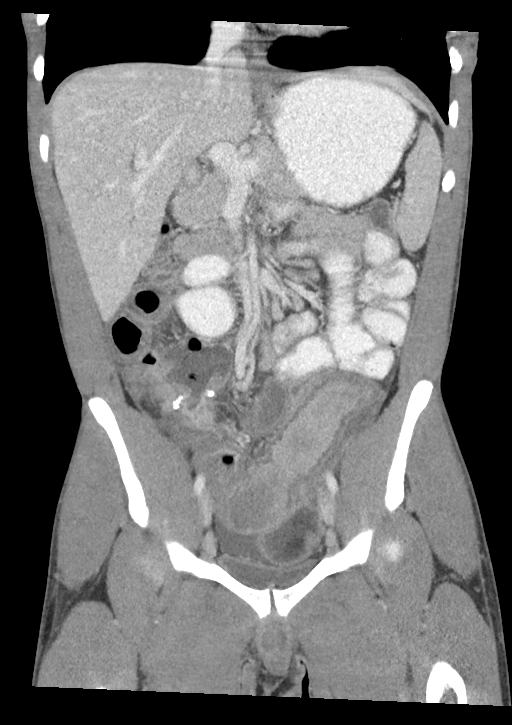
[im 43/77  soft-tissue]
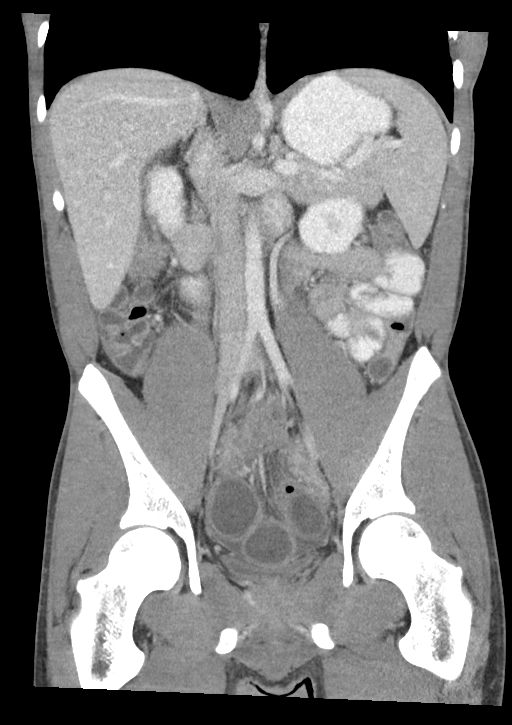

[15 of 46 positions shown; findings below may reference images not displayed]

FINDINGS: Lower chest: No significant pulmonary nodules or acute consolidative
airspace disease.

Hepatobiliary: Normal liver with no liver mass. Normal gallbladder
with no radiopaque cholelithiasis. Minimal central intrahepatic
biliary ductal dilatation. Normal caliber common bile duct, 5 mm
diameter. No radiopaque choledocholithiasis.

Pancreas: Normal, with no mass or duct dilation.

Spleen: Normal size. No mass.

Adrenals/Urinary Tract: Normal adrenals. Normal kidneys with no
hydronephrosis and no renal mass. Normal bladder.

Stomach/Bowel: Grossly normal stomach. There is a surgical
anastomosis in the right abdomen, suspected to represent a neo
ileocolic anastomosis status post ileocecal resection. There is an
abnormal small bowel segment in the anterior pelvis measuring
approximately 15 cm in length, demonstrating circumferential small
bowel wall thickening and hyperenhancement (series 2/ image 61),
proximal to which small bowel loops are mildly dilated up to 3.9 cm
diameter with fluid levels. There is also abnormal small bowel wall
thickening and hyperenhancement in the neo terminal ileum (series 5/
image 34). There is mild wall thickening with mucosal
hyperenhancement in the mid sigmoid colon.

Vascular/Lymphatic: Normal caliber abdominal aorta. Patent portal,
splenic, hepatic and renal veins. No pathologically enlarged lymph
nodes in the abdomen or pelvis.

Reproductive: Normal size prostate.

Other: No pneumoperitoneum. Trace pelvic and right paracolic gutter
ascites. No focal fluid collection or fistula detected.

Musculoskeletal: No aggressive appearing focal osseous lesions. No
evidence of sacroiliitis.
IMPRESSION: 1. Surgical anastomosis in the right abdomen, suspect neo-ileocolic
anastomosis status post ileocecal resection.
2. Suspected active Crohn ileitis within an abnormal long small
bowel segment in the anterior pelvis, with associated functional
mild small bowel obstruction.
3. Active Crohn ileitis in the neo-terminal ileum. Active Crohn
colitis in the mid sigmoid colon.
4. No fistula, abscess or free air detected.  Trace ascites.
5. Minimal central intrahepatic biliary ductal dilatation. Normal
caliber common bile duct. Recommend correlation with serum bilirubin
levels.

## 2018-11-12 IMAGING — DX DG ABDOMEN 1V
1 series · 1 of 1 positions shown · non-contrast
Comparison: CT of the abdomen pelvis 04/05/2017.

CLINICAL DATA: Abdominal pain for 4 days. Personal history crest
disease.

EXAM:
ABDOMEN - 1 VIEW

[abdomen kub]
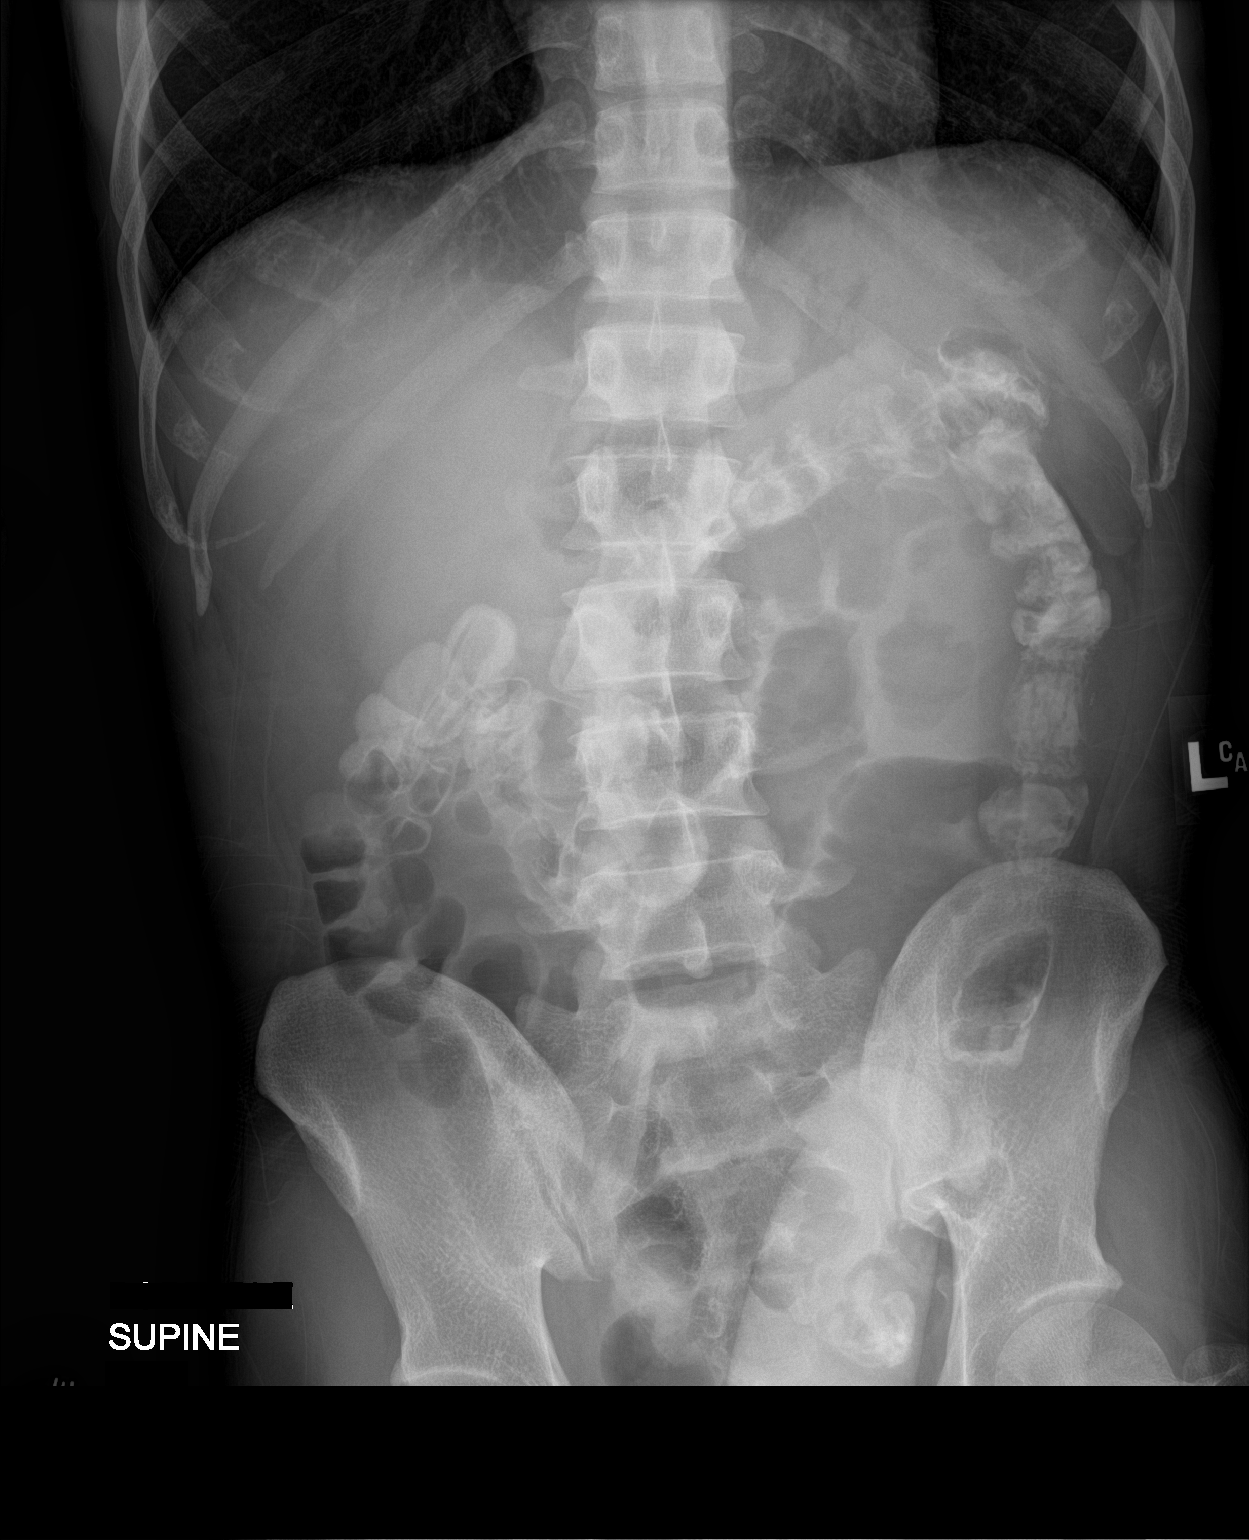

[1 of 1 positions shown; findings below may reference images not displayed]

FINDINGS: Distention of distal small bowel is again seen. Small bowel is
partially decompressed compared to yesterday's study. Contrast
material is now seen throughout the colon which is decompressed.
There is no free air. The lung bases are clear. Mild rightward
curvature is present in the lumbar spine.
IMPRESSION: 1. Partial decompression of small bowel.
2. Transit of oral contrast into the colon.

## 2019-01-16 ENCOUNTER — Inpatient Hospital Stay: Payer: BLUE CROSS/BLUE SHIELD | Attending: Hematology and Oncology

## 2019-01-16 DIAGNOSIS — D500 Iron deficiency anemia secondary to blood loss (chronic): Secondary | ICD-10-CM

## 2019-01-16 DIAGNOSIS — Z79899 Other long term (current) drug therapy: Secondary | ICD-10-CM | POA: Diagnosis not present

## 2019-01-16 DIAGNOSIS — D508 Other iron deficiency anemias: Secondary | ICD-10-CM | POA: Insufficient documentation

## 2019-01-16 DIAGNOSIS — K5010 Crohn's disease of large intestine without complications: Secondary | ICD-10-CM | POA: Diagnosis not present

## 2019-01-16 LAB — CBC WITH DIFFERENTIAL/PLATELET
Abs Immature Granulocytes: 0.02 10*3/uL (ref 0.00–0.07)
BASOS ABS: 0.1 10*3/uL (ref 0.0–0.1)
Basophils Relative: 1 %
Eosinophils Absolute: 0.1 10*3/uL (ref 0.0–0.5)
Eosinophils Relative: 1 %
HEMATOCRIT: 48.3 % (ref 39.0–52.0)
Hemoglobin: 14.4 g/dL (ref 13.0–17.0)
Immature Granulocytes: 0 %
LYMPHS ABS: 2.3 10*3/uL (ref 0.7–4.0)
Lymphocytes Relative: 24 %
MCH: 24.9 pg — ABNORMAL LOW (ref 26.0–34.0)
MCHC: 29.8 g/dL — ABNORMAL LOW (ref 30.0–36.0)
MCV: 83.6 fL (ref 80.0–100.0)
Monocytes Absolute: 0.6 10*3/uL (ref 0.1–1.0)
Monocytes Relative: 6 %
NEUTROS PCT: 68 %
Neutro Abs: 6.4 10*3/uL (ref 1.7–7.7)
Platelets: 241 10*3/uL (ref 150–400)
RBC: 5.78 MIL/uL (ref 4.22–5.81)
RDW: 13.7 % (ref 11.5–15.5)
WBC: 9.5 10*3/uL (ref 4.0–10.5)
nRBC: 0 % (ref 0.0–0.2)

## 2019-01-16 LAB — FERRITIN: Ferritin: 26 ng/mL (ref 24–336)

## 2019-01-17 ENCOUNTER — Inpatient Hospital Stay (HOSPITAL_BASED_OUTPATIENT_CLINIC_OR_DEPARTMENT_OTHER): Payer: BLUE CROSS/BLUE SHIELD | Admitting: Hematology and Oncology

## 2019-01-17 ENCOUNTER — Inpatient Hospital Stay: Payer: BLUE CROSS/BLUE SHIELD

## 2019-01-17 ENCOUNTER — Encounter: Payer: Self-pay | Admitting: Hematology and Oncology

## 2019-01-17 VITALS — BP 108/70 | HR 89 | Temp 97.7°F | Resp 18 | Ht 64.0 in | Wt 118.5 lb

## 2019-01-17 DIAGNOSIS — D500 Iron deficiency anemia secondary to blood loss (chronic): Secondary | ICD-10-CM

## 2019-01-17 DIAGNOSIS — D508 Other iron deficiency anemias: Secondary | ICD-10-CM

## 2019-01-17 DIAGNOSIS — K5090 Crohn's disease, unspecified, without complications: Secondary | ICD-10-CM

## 2019-01-17 NOTE — Progress Notes (Signed)
[facility] day:  01/17/2019  Chief Complaint: Daniel Bird is a 28 y.o. male with Crohn's disease and recurrent iron deficiency anemia who is seen for 6 month assessment.  HPI: The patient  was last seen in the hematology clinic on 07/12/2018 by Honor Loh, NP.  At that time, he was becoming more fatigued. He denied bleeding; no hematochezia, melena, or gross hematuria. He was eating well; weight was down 5 pounds. No ice pica or restless legs.  Exam was stable.   Ferritin was 25.  He received Feraheme.  CBC on 08/23/2018 revealed a hemoglobin 14.6, hematocrit 45.6, MCV 81.5.  Ferritin  71. CBC on 10/11/2018 revealed a hemoglobin 14.7, hematocrit 48.1, MCV 83.8.  Ferritin  50. CBC on 01/16/2019 revealed a hemoglobin 14.4, hematocrit 48.3, MCV 83.6.  Ferritin  26.  He was seen in the [facility] on 12/10/2018 by Stephens November.  Notes reviewed.  Crohn's was stable.  Endoscopy due 2021.  During the interim, he has felt alright.  He is eating a lot more.  Weight is up 5 pounds.  His Crohn's disease is "not acting up".  He is receiving Cimzia (certolizumab).  He denies any bleeding.   Past Medical History:  Diagnosis Date  . Allergic state   . Anemia    IRON DEFICIENCY ANEMIA  . Crohn's disease ([place])   . Eczema   . Gastritis   . GERD (gastroesophageal reflux disease)   . Hemorrhoids   . Psoriasis     Past Surgical History:  Procedure Laterality Date  . APPENDECTOMY     May 2014  . COLON SURGERY     colectomy partial w/anastamosis  . COLONOSCOPY    . COLONOSCOPY WITH PROPOFOL N/A 02/05/2017   Procedure: COLONOSCOPY WITH PROPOFOL;  Surgeon: Lollie Sails, MD;  Location: [facility] ENDOSCOPY;  Service: Endoscopy;  Laterality: N/A;  . ESOPHAGOGASTRODUODENOSCOPY    . ESOPHAGOGASTRODUODENOSCOPY N/A 02/05/2017   Procedure: ESOPHAGOGASTRODUODENOSCOPY (EGD);  Surgeon: Lollie Sails, MD;  Location: [facility] ENDOSCOPY;  Service:  Endoscopy;  Laterality: N/A;  . ESOPHAGOGASTRODUODENOSCOPY (EGD) WITH PROPOFOL N/A 09/27/2017   Procedure: ESOPHAGOGASTRODUODENOSCOPY (EGD) WITH PROPOFOL;  Surgeon: Lollie Sails, MD;  Location: [facility] ENDOSCOPY;  Service: Endoscopy;  Laterality: N/A;   History reviewed. No pertinent family history.  He is an only child.  Social History:  reports that he has never smoked. He has never used smokeless tobacco. He reports that he does not drink alcohol or use drugs.  The patient is accompanied by his great aunt, Vaughan Basta.  He lives with his grandmother.  He completed college.  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  Cell number is : [phone].  He is alone today.  Allergies:  Allergies  Allergen Reactions  . Sulfamethoxazole-Trimethoprim Anaphylaxis  . Fish Allergy Swelling    hives  . Other Swelling    Tree nuts - also causes hives  . Rifampin Other (See Comments)    Flu like illness    Current Medications: Current Outpatient Medications  Medication Sig Dispense Refill  . budesonide (ENTOCORT EC) 3 MG 24 hr capsule Take 3 mg by mouth at bedtime.    . Certolizumab Pegol 2 X 200 MG/ML KIT Inject into the skin every 14 (fourteen) days.     . Iron Dextran (INFED IJ) Inject 100 mg into the vein every 3 (three) months.    . mesalamine (LIALDA) 1.2 G EC tablet  Take 1.2 g by mouth daily.     . fluticasone (FLONASE) 50 MCG/ACT nasal spray Place 1 spray into the nose daily.    Marland Kitchen omeprazole (PRILOSEC) 40 MG capsule Take 40 mg by mouth daily as needed.      No current facility-administered medications for this visit.     Review of Systems  Constitutional: Negative.  Negative for chills, diaphoresis, fever, malaise/fatigue and weight loss (up 5 pounds).       Feels "alright".  HENT: Negative.  Negative for congestion, ear pain, nosebleeds, sinus pain and sore throat.   Eyes: Negative.  Negative for blurred vision, double vision, photophobia  and pain.  Respiratory: Negative.  Negative for cough, hemoptysis, sputum production and shortness of breath.   Cardiovascular: Negative.  Negative for chest pain, palpitations, orthopnea, leg swelling and PND.  Gastrointestinal: Negative for abdominal pain, blood in stool, constipation, diarrhea, heartburn, melena, nausea and vomiting.       Eating well.  Crohn's under control.  Genitourinary: Negative.  Negative for dysuria, frequency, hematuria and urgency.  Musculoskeletal: Negative.  Negative for back pain, falls, joint pain, myalgias and neck pain.  Skin: Negative.  Negative for itching and rash.  Neurological: Negative.  Negative for dizziness, tremors, sensory change, speech change, focal weakness, weakness and headaches.  Endo/Heme/Allergies: Negative.  Does not bruise/bleed easily.  Psychiatric/Behavioral: Negative.  Negative for depression and memory loss. The patient is not nervous/anxious and does not have insomnia.   All other systems reviewed and are negative.  Performance status (ECOG): 0  Vital Signs BP 108/70 (BP Location: Left Arm, Patient Position: Sitting)   Pulse 89   Temp 97.7 F (36.5 C) (Tympanic)   Resp 18   Ht 5' 4"  (1.626 m)   Wt 118 lb 8 oz (53.8 kg)   SpO2 100%   BMI 20.34 kg/m   Physical Exam  Constitutional: He is oriented to person, place, and time and well-developed, well-nourished, and in no distress. No distress.  HENT:  Head: Normocephalic and atraumatic.  Mouth/Throat: Oropharynx is clear and moist. No oropharyngeal exudate.  Curly black hair.  Eyes: Pupils are equal, round, and reactive to light. Conjunctivae and EOM are normal. No scleral icterus.  Glasses.  Brown eyes.  Neck: Normal range of motion. Neck supple. No JVD present.  Cardiovascular: Normal rate, regular rhythm and normal heart sounds. Exam reveals no gallop and no friction rub.  No murmur heard. Pulmonary/Chest: Effort normal and breath sounds normal. No respiratory distress.  He has no wheezes. He has no rales.  Abdominal: Soft. Bowel sounds are normal. He exhibits no distension and no mass. There is no abdominal tenderness. There is no rebound and no guarding.  Musculoskeletal: Normal range of motion.        General: No tenderness or edema.  Lymphadenopathy:    He has no cervical adenopathy.    He has no axillary adenopathy.       Right: No inguinal and no supraclavicular adenopathy present.       Left: No inguinal and no supraclavicular adenopathy present.  Neurological: He is alert and oriented to person, place, and time. Gait normal.  Skin: Skin is warm and dry. No rash noted. He is not diaphoretic. No erythema. No pallor.  Psychiatric: Mood, affect and judgment normal.  Nursing note and vitals reviewed.   Appointment on 01/16/2019  Component Date Value Ref Range Status  . Ferritin 01/16/2019 26  24 - 336 ng/mL Final   Performed at [facility], [address] [place], [place] [address]  . WBC 01/16/2019 9.5  4.0 - 10.5 K/uL Final  . RBC 01/16/2019 5.78  4.22 - 5.81 MIL/uL Final  . Hemoglobin 01/16/2019 14.4  13.0 - 17.0 g/dL Final  . HCT 01/16/2019 48.3  39.0 - 52.0 % Final  . MCV 01/16/2019 83.6  80.0 - 100.0 fL Final  . MCH 01/16/2019 24.9* 26.0 - 34.0 pg Final  . MCHC 01/16/2019 29.8* 30.0 - 36.0 g/dL Final  . RDW 01/16/2019 13.7  11.5 - 15.5 % Final  . Platelets 01/16/2019 241  150 - 400 K/uL Final  . nRBC 01/16/2019 0.0  0.0 - 0.2 % Final  . Neutrophils Relative % 01/16/2019 68  % Final  . Neutro Abs 01/16/2019 6.4  1.7 - 7.7 K/uL Final  . Lymphocytes Relative 01/16/2019 24  % Final  . Lymphs Abs 01/16/2019 2.3  0.7 - 4.0 K/uL Final  . Monocytes Relative 01/16/2019 6  % Final  . Monocytes Absolute 01/16/2019 0.6  0.1 - 1.0 K/uL Final  . Eosinophils Relative 01/16/2019 1  % Final  . Eosinophils Absolute 01/16/2019 0.1  0.0 - 0.5 K/uL Final  . Basophils Relative 01/16/2019 1  % Final  . Basophils Absolute 01/16/2019 0.1  0.0 - 0.1  K/uL Final  . Immature Granulocytes 01/16/2019 0  % Final  . Abs Immature Granulocytes 01/16/2019 0.02  0.00 - 0.07 K/uL Final   Performed at [facility], [address], [place], [place] [address]    Assessment:  Erice Ahles is a 28 y.o. male with Crohn's disease and recurrent iron deficiency anemia.  He received IV iron 3-4 times in 2015 while living in [place].  Colonoscopy in 2015 was negative per patient report.  He may have celiac disease.  Transglutaminase IgG was 10 (> 9) with an IgA of <5 (90 - 386).   EGD on 02/05/2017 revealed a normal esophagus, gastritis and multiple mucosal papules (nodules) in the stomach. There were multiple nonbleeding duodenal ulcers with white base.  Duodenal biopsy revealed moderate chronic active duodenitis with villous blunting and ulceration. There was no granulomatous or infectious agents.  The stomach revealed marked chronic active gastritis.   Colonoscopy on 02/05/2017 revealed scattered/small areas of minimal erythema. The ileocolonic anastomosis revealed mild inflammation. The ileal opening was stenotic with inability to advance the scope into the neoterminal ileum. Biopsies of the ileal side of the anastomosis revealed marked chronic active ileitis with erosion and 2 tiny poorly formed granulomas. Additional biopsies were negative for colitis in the ascending, transverse and right colon.  All samples were negative for dysplasia and malignancy.  He was [facility] to [facility] from 04/05/2017 - 04/07/2017 with generalized abdominal pain, and nausea and vomiting.  Abdomen and pelvic CT scan on 04/05/2017 revealed active Crohn ileitis within an abnormal long small bowel segment in the anterior pelvis, with associated functional mild small bowel obstruction.  There was active Crohn ileitis in the neo-terminal ileum and active Crohn colitis in the mid sigmoid colon.  There was no fistula, abscess or free air detected.  There was trace ascites.  He has  received Feraheme on several occasions (08/27/2015, 11/12/2015, 02/04/2016,  03/20/2017, 01/11/2018, 07/12/2018).  Ferritin has been followed: 10 on 05/18/2015, 11 on 08/17/2015, 13 on 09/28/2015, 7 on 11/11/2015, 11 on 02/03/2016, 43 on 04/27/2016, 33 on 07/25/2016, 24 on 10/19/2016, 19 on 03/01/2017, 28 on 07/12/2017, 12 on 10/15/2017, 10 on 01/10/2018, 32 on 04/11/2018, 25 on 07/11/2018, 71  on 08/23/2018, 50 on 10/11/2018, and 26 on 01/16/2019.  Symptomatically, he is doing well.  Crohn's is well managed.  He denies any bleeding.  Exam is unremarkable.  Hemoglobin is 14.4.  Plan: 1. Review labs from 01/16/2019.   2. Iron deficiency anemia  Hematocrit 48.3.  Hemoglobin 14.4.  Ferritin 26.  Discuss postponing Feraheme today  Continue close monitoring of hemoglobin and iron stores. 3. Discuss transition to clinic in [place].  Patient would like to remain in [place]. 4. RTC in 6 weeks for labs (CBC with diff, ferritin). 5. RTC in 3 months for labs (CBC with diff, ferritin). 6.   RTC in 6 months for MD assessment (Dr Tasia Catchings), labs (CBC with diff, ferritin- day before), and +/- Feraheme.   I discussed the assessment and treatment plan with the patient.  The patient was provided an opportunity to ask questions and all were answered.  The patient agreed with the plan and demonstrated an understanding of the instructions.  The patient was advised to call back or seek an in person evaluation if the symptoms worsen or if the condition fails to improve as anticipated.    Lequita Asal, MD 01/17/19, 4:35 PM

## 2019-01-17 NOTE — Progress Notes (Signed)
No new changes noted today 

## 2019-03-31 ENCOUNTER — Other Ambulatory Visit: Payer: BLUE CROSS/BLUE SHIELD

## 2019-04-18 ENCOUNTER — Other Ambulatory Visit: Payer: BLUE CROSS/BLUE SHIELD

## 2019-07-18 ENCOUNTER — Ambulatory Visit: Payer: BLUE CROSS/BLUE SHIELD | Admitting: Oncology

## 2019-07-18 ENCOUNTER — Ambulatory Visit: Payer: BLUE CROSS/BLUE SHIELD

## 2019-07-18 ENCOUNTER — Other Ambulatory Visit: Payer: BLUE CROSS/BLUE SHIELD

## 2022-10-23 ENCOUNTER — Encounter: Payer: Self-pay | Admitting: Emergency Medicine

## 2022-10-23 ENCOUNTER — Ambulatory Visit
Admission: EM | Admit: 2022-10-23 | Discharge: 2022-10-23 | Disposition: A | Discharge status: Home / Self Care | Payer: BLUE CROSS/BLUE SHIELD | Attending: Emergency Medicine | Admitting: Emergency Medicine

## 2022-10-23 ENCOUNTER — Encounter: Payer: Self-pay | Admitting: Hematology and Oncology

## 2022-10-23 DIAGNOSIS — B370 Candidal stomatitis: Secondary | ICD-10-CM | POA: Diagnosis not present

## 2022-10-23 MED ORDER — NYSTATIN 100000 UNIT/ML MT SUSP: 500000.0000 [IU] | Freq: Four times a day (QID) | OROMUCOSAL | 0 refills | Status: AC

## 2022-10-23 NOTE — Discharge Instructions (Addendum)
Use the Nystatin oral suspension as directed.  Follow up with your primary care provider if your symptoms are not improving.

## 2022-10-23 NOTE — ED Provider Notes (Signed)
Daniel Bird    CSN: [id number] Arrival date & time: 10/23/22  1526      History   Chief Complaint Chief Complaint  Patient presents with   Rash    HPI Daniel Bird is a 31 y.o. male.  Patient presents with 2 to 3-week history of white patches in his mouth and throat.  This is similar to previous episode of thrush that occurred several years ago.  He denies fever, chills, sore throat, cough, shortness of breath, or other symptoms.  No treatments at home.  His medical history includes Crohn's disease, GERD, eczema, psoriasis.  The history is provided by the patient and medical records.    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   Thrush 05/04/2017   Colitis 04/05/2017   Acid reflux 08/17/2015   Iron deficiency anemia 05/18/2015   Crohn's disease ([place]) 05/18/2015    Past Surgical History:  Procedure Laterality Date   APPENDECTOMY     May 2014   COLON SURGERY     colectomy partial w/anastamosis   COLONOSCOPY     COLONOSCOPY WITH PROPOFOL N/A 02/05/2017   Procedure: COLONOSCOPY WITH PROPOFOL;  Surgeon: Lollie Sails, MD;  Location: [facility] ENDOSCOPY;  Service: Endoscopy;  Laterality: N/A;   ESOPHAGOGASTRODUODENOSCOPY     ESOPHAGOGASTRODUODENOSCOPY N/A 02/05/2017   Procedure: ESOPHAGOGASTRODUODENOSCOPY (EGD);  Surgeon: Lollie Sails, MD;  Location: [facility] ENDOSCOPY;  Service: Endoscopy;  Laterality: N/A;   ESOPHAGOGASTRODUODENOSCOPY (EGD) WITH PROPOFOL N/A 09/27/2017   Procedure: ESOPHAGOGASTRODUODENOSCOPY (EGD) WITH PROPOFOL;  Surgeon: Lollie Sails, MD;  Location: [facility] ENDOSCOPY;  Service: Endoscopy;  Laterality: N/A;       Home Medications    Prior to Admission medications    Medication Sig Start Date End Date Taking? Authorizing Provider  nystatin (MYCOSTATIN) 100000 UNIT/ML suspension Take 5 mLs (500,000 Units total) by mouth 4 (four) times daily for 10 days. 10/23/22 11/02/22 Yes Sharion Balloon, NP  budesonide (ENTOCORT EC) 3 MG 24 hr capsule Take 3 mg by mouth at bedtime.    [provider]  Certolizumab Pegol 2 X 200 MG/ML KIT Inject into the skin every 14 (fourteen) days.     [provider]  fluticasone (FLONASE) 50 MCG/ACT nasal spray Place 1 spray into the nose daily. 08/21/16   [provider]  Iron Dextran (INFED IJ) Inject 100 mg into the vein every 3 (three) months.    [provider]  mesalamine (LIALDA) 1.2 G EC tablet Take 1.2 g by mouth daily.     [provider]  omeprazole (PRILOSEC) 40 MG capsule Take 40 mg by mouth daily as needed.     [provider]    Family History History reviewed. No pertinent family history.  Social History Social History   Tobacco Use   Smoking status: Never   Smokeless tobacco: Never  Substance Use Topics   Alcohol use: No   Drug use: No     Allergies   Sulfamethoxazole-trimethoprim, Fish allergy, Other, and Rifampin   Review of Systems Review of Systems  Constitutional:  Negative for chills and fever.  HENT:  Negative for ear pain, sore throat, trouble  swallowing and voice change.        White plaques in mouth.   Respiratory:  Negative for cough and shortness of breath.   Cardiovascular:  Negative for chest pain and palpitations.  All other systems reviewed and are negative.    Physical Exam Triage Vital Signs ED Triage Vitals [10/23/22 1543]  Enc Vitals Group     BP 131/85     Pulse Rate 87     Resp 18     Temp 98.1 F (36.7 C)     Temp src      SpO2 99 %     Weight      Height      Head Circumference      Peak Flow      Pain Score      Pain Loc      Pain Edu?      Excl. in [place]?    No data found.  Updated Vital Signs BP 131/85    Pulse 87   Temp 98.1 F (36.7 C)   Resp 18   SpO2 99%   Visual Acuity Right Eye Distance:   Left Eye Distance:   Bilateral Distance:    Right Eye Near:   Left Eye Near:    Bilateral Near:     Physical Exam Vitals and nursing note reviewed.  Constitutional:      General: He is not in acute distress.    Appearance: Normal appearance. He is well-developed. He is not ill-appearing.  HENT:     Right Ear: Tympanic membrane normal.     Left Ear: Tympanic membrane normal.     Nose: Nose normal.     Mouth/Throat:     Mouth: Mucous membranes are moist.     Pharynx: Oropharyngeal exudate present. No posterior oropharyngeal erythema.     Comments: White exudate on tongue, buccal mucosa, and palate.  Cardiovascular:     Rate and Rhythm: Normal rate and regular rhythm.     Heart sounds: Normal heart sounds.  Pulmonary:     Effort: Pulmonary effort is normal. No respiratory distress.     Breath sounds: Normal breath sounds.  Musculoskeletal:     Cervical back: Neck supple.  Skin:    General: Skin is warm and dry.  Neurological:     Mental Status: He is alert.  Psychiatric:        Mood and Affect: Mood normal.        Behavior: Behavior normal.      UC Treatments / Results  Labs (all labs ordered are listed, but only abnormal results are displayed) Labs Reviewed - No data to display  EKG   Radiology No results found.  Procedures Procedures (including critical care time)  Medications Ordered in UC Medications - No data to display  Initial Impression / Assessment and Plan / UC Course  I have reviewed the triage vital signs and the nursing notes.  Pertinent labs & imaging results that were available during my care of the patient were reviewed by me and considered in my medical decision making (see chart for details).    Oral thrush.  Afebrile and vital signs are stable.  Treating with nystatin suspension.  Instructed patient to follow-up with his PCP if his  symptoms are not improving.  Education provided on thrush.  He agrees to plan of care.  Final Clinical Impressions(s) / UC Diagnoses   Final diagnoses:  Oral thrush     Discharge Instructions  Use the Nystatin oral suspension as directed.  Follow up with your primary care provider if your symptoms are not improving.        ED Prescriptions     Medication Sig Dispense Auth. Provider   nystatin (MYCOSTATIN) 100000 UNIT/ML suspension Take 5 mLs (500,000 Units total) by mouth 4 (four) times daily for 10 days. 200 mL Sharion Balloon, NP      PDMP not reviewed this encounter.   Sharion Balloon, NP 10/23/22 [phone]

## 2022-10-23 NOTE — ED Triage Notes (Signed)
Patient to Urgent Care with complaints of a rash present in his mouth for 2-3 weeks. Denies any fevers, cough, shob, n/v/d.

## 2023-02-27 ENCOUNTER — Encounter: Payer: Self-pay | Admitting: Hematology and Oncology
